# Patient Record
Sex: Male | Born: 1955 | Race: Black or African American | Hispanic: No | Marital: Married | Smoking: Never smoker
Health system: Southern US, Community
[De-identification: ages and names within clinical notes are randomized; demographics above are authoritative.]

## PROBLEM LIST (undated history)

## (undated) DIAGNOSIS — J45909 Unspecified asthma, uncomplicated: Secondary | ICD-10-CM

## (undated) DIAGNOSIS — I10 Essential (primary) hypertension: Secondary | ICD-10-CM

## (undated) DIAGNOSIS — T7840XA Allergy, unspecified, initial encounter: Secondary | ICD-10-CM

## (undated) DIAGNOSIS — H548 Legal blindness, as defined in USA: Secondary | ICD-10-CM

## (undated) DIAGNOSIS — C801 Malignant (primary) neoplasm, unspecified: Secondary | ICD-10-CM

## (undated) HISTORY — DX: Legal blindness, as defined in USA: H54.8

## (undated) HISTORY — PX: FOOT SURGERY: SHX648

## (undated) HISTORY — PX: HAND SURGERY: SHX662

## (undated) HISTORY — PX: COLON SURGERY: SHX602

## (undated) HISTORY — PX: KNEE SURGERY: SHX244

## (undated) HISTORY — DX: Malignant (primary) neoplasm, unspecified: C80.1

## (undated) HISTORY — DX: Essential (primary) hypertension: I10

## (undated) HISTORY — DX: Allergy, unspecified, initial encounter: T78.40XA

---

## 2006-05-27 ENCOUNTER — Emergency Department (HOSPITAL_COMMUNITY): Admission: EM | Admit: 2006-05-27 | Discharge: 2006-05-27 | Payer: Self-pay | Admitting: *Deleted

## 2006-05-31 ENCOUNTER — Emergency Department (HOSPITAL_COMMUNITY): Admission: EM | Admit: 2006-05-31 | Discharge: 2006-06-01 | Payer: Self-pay | Admitting: Emergency Medicine

## 2006-06-15 ENCOUNTER — Emergency Department (HOSPITAL_COMMUNITY): Admission: EM | Admit: 2006-06-15 | Discharge: 2006-06-16 | Payer: Self-pay | Admitting: Emergency Medicine

## 2007-02-14 ENCOUNTER — Emergency Department (HOSPITAL_COMMUNITY): Admission: EM | Admit: 2007-02-14 | Discharge: 2007-02-14 | Payer: Self-pay | Admitting: *Deleted

## 2007-08-01 ENCOUNTER — Emergency Department (HOSPITAL_COMMUNITY): Admission: EM | Admit: 2007-08-01 | Discharge: 2007-08-01 | Payer: Self-pay | Admitting: Emergency Medicine

## 2009-09-04 ENCOUNTER — Emergency Department (HOSPITAL_COMMUNITY): Admission: EM | Admit: 2009-09-04 | Discharge: 2009-09-04 | Payer: Self-pay | Admitting: Emergency Medicine

## 2010-02-12 ENCOUNTER — Emergency Department (HOSPITAL_COMMUNITY): Admission: EM | Admit: 2010-02-12 | Discharge: 2010-02-12 | Payer: Self-pay | Admitting: Emergency Medicine

## 2010-06-16 ENCOUNTER — Emergency Department (HOSPITAL_COMMUNITY): Admission: EM | Admit: 2010-06-16 | Discharge: 2010-06-16 | Payer: Self-pay | Admitting: Emergency Medicine

## 2010-09-23 ENCOUNTER — Emergency Department (HOSPITAL_COMMUNITY)
Admission: EM | Admit: 2010-09-23 | Discharge: 2010-09-23 | Payer: Self-pay | Source: Home / Self Care | Admitting: Emergency Medicine

## 2010-10-15 ENCOUNTER — Emergency Department (HOSPITAL_COMMUNITY)
Admission: EM | Admit: 2010-10-15 | Discharge: 2010-10-15 | Payer: Self-pay | Source: Home / Self Care | Admitting: Emergency Medicine

## 2011-04-08 ENCOUNTER — Ambulatory Visit
Admission: RE | Admit: 2011-04-08 | Discharge: 2011-04-08 | Disposition: A | Discharge status: Home / Self Care | Payer: Medicare Other | Source: Ambulatory Visit | Attending: Gastroenterology | Admitting: Gastroenterology

## 2011-04-08 ENCOUNTER — Other Ambulatory Visit: Payer: Self-pay | Admitting: Gastroenterology

## 2011-04-08 DIAGNOSIS — K6389 Other specified diseases of intestine: Secondary | ICD-10-CM

## 2011-04-08 MED ORDER — IOHEXOL 300 MG/ML  SOLN
125.0000 mL | Freq: Once | INTRAMUSCULAR | Status: AC | PRN
  Administered 2011-04-08: 125 mL via INTRAVENOUS

## 2011-04-16 ENCOUNTER — Ambulatory Visit (INDEPENDENT_AMBULATORY_CARE_PROVIDER_SITE_OTHER): Payer: Medicare Other | Admitting: Surgery

## 2011-04-16 ENCOUNTER — Encounter (INDEPENDENT_AMBULATORY_CARE_PROVIDER_SITE_OTHER): Payer: Self-pay | Admitting: Surgery

## 2011-04-16 ENCOUNTER — Ambulatory Visit (INDEPENDENT_AMBULATORY_CARE_PROVIDER_SITE_OTHER): Payer: Medicare Other | Admitting: General Surgery

## 2011-04-16 VITALS — BP 142/79 | HR 71 | Temp 97.7°F | Ht 69.0 in | Wt 299.0 lb

## 2011-04-16 DIAGNOSIS — I10 Essential (primary) hypertension: Secondary | ICD-10-CM

## 2011-04-16 DIAGNOSIS — E669 Obesity, unspecified: Secondary | ICD-10-CM

## 2011-04-16 DIAGNOSIS — C189 Malignant neoplasm of colon, unspecified: Secondary | ICD-10-CM

## 2011-04-16 NOTE — Patient Instructions (Signed)
Office to schedule surgery to remove sigmoid colon cancer.

## 2011-04-16 NOTE — Progress Notes (Signed)
Subjective:     Patient ID: Ralph Ryan, male   DOB: 06-Sep-1956, 55 y.o.   MRN: [id number]    BP 142/79  Pulse 71  Temp(Src) 97.7 F (36.5 C) (Temporal)  Ht 5\' 9"  (1.753 m)  Wt 299 lb (135.626 kg)  BMI 44.15 kg/m2    HPI The patient is sent today at the request of Dr. Jeani Hawking do to a mass in his sigmoid colon. The patient was having rectal bleeding and constipation. He underwent a colonoscopy on April 07, 2011 for rectal bleeding and constipation. His daughter states that he has also had abdominal pain. The pain is diffuse and not severe. It is any cramping abdominal pain. It comes and goes. Last only for a few minutes to a few hours. The rectal bleeding is noted when he wipes. There is no bright red blood. 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 He is 22 cm from the anal verge.  Review of Systems  Constitutional: Positive for fatigue.  HENT: Negative.   Eyes: Negative.   Respiratory: Positive for shortness of breath. Negative for stridor.   Cardiovascular: Positive for leg swelling. Negative for chest pain.  Gastrointestinal: Positive for abdominal pain, constipation, blood in stool and anal bleeding.  Genitourinary: Negative.   Musculoskeletal: Negative.   Skin: Negative.   Neurological: Negative.   Hematological: Negative.   Psychiatric/Behavioral: Negative.        Objective:   Physical Exam  Constitutional: He is oriented to person, place, and time. He appears well-developed and well-nourished.  HENT:  Head: Normocephalic and atraumatic.  Eyes: Conjunctivae are normal. Pupils are equal, round, and reactive to light.  Neck: Normal range of motion. Neck supple.  Cardiovascular: Normal rate, regular rhythm and normal heart sounds.  Exam reveals no friction rub.   No murmur heard. Pulmonary/Chest: Effort normal and breath sounds normal. No respiratory distress. He has no wheezes.  Abdominal: Soft. Bowel sounds are  normal. He exhibits no distension and no mass. There is no tenderness. There is no rebound.  Musculoskeletal: Normal range of motion. He exhibits tenderness.  Neurological: He is alert and oriented to person, place, and time.  Skin: Skin is warm and dry.  Psychiatric: He has a normal mood and affect. His behavior is normal.       Assessment:   Sigmoid colon cancer Plan:   The patient has an adenocarcinoma in the sigmoid colon. This is biopsy-proven to be adenocarcinoma. It is 22 cm from the anal verge. CT scan of his abdomen and pelvis reveals no metastatic disease. He is obese. He also has other medical problems. I discussed treatment options and feel that sigmoid colectomy is his best option. This is not amenable to endoscopic resection and is not recommended. This can be done with a laparoscopic approach. I discussed the risks of the procedure with the patient and his family today. Risks include bleeding, infection, anastomotic leakage, sepsis, potential need for colostomy, further surgery, cardiovascular complications, DVT, and worsening of pre-existing medical conditions. They understand these potential complications but wished to proceed with surgical treatment of this problem.

## 2011-04-23 ENCOUNTER — Encounter (INDEPENDENT_AMBULATORY_CARE_PROVIDER_SITE_OTHER): Payer: Medicare Other | Admitting: General Surgery

## 2011-04-28 ENCOUNTER — Encounter (HOSPITAL_COMMUNITY)
Admission: RE | Admit: 2011-04-28 | Discharge: 2011-04-28 | Disposition: A | Discharge status: Home / Self Care | Payer: Medicare Other | Source: Ambulatory Visit | Attending: Surgery | Admitting: Surgery

## 2011-04-28 LAB — COMPREHENSIVE METABOLIC PANEL
ALT: 9 U/L (ref 0–53)
Alkaline Phosphatase: 64 U/L (ref 39–117)
Chloride: 101 mEq/L (ref 96–112)
Creatinine, Ser: 0.97 mg/dL (ref 0.50–1.35)
GFR calc Af Amer: >60 mL/min (ref >60)
GFR calc non Af Amer: >60 mL/min (ref >60)
Potassium: 4.3 mEq/L (ref 3.5–5.1)
Sodium: 136 mEq/L (ref 135–145)
Total Bilirubin: 0.2 mg/dL — ABNORMAL LOW (ref 0.3–1.2)

## 2011-04-28 LAB — SURGICAL PCR SCREEN
MRSA, PCR: NEGATIVE
Staphylococcus aureus: NEGATIVE

## 2011-04-28 LAB — DIFFERENTIAL
Basophils Absolute: 0 10*3/uL (ref 0.0–0.1)
Eosinophils Relative: 2 % (ref 0–5)
Lymphocytes Relative: 42 % (ref 12–46)
Lymphs Abs: 1.8 10*3/uL (ref 0.7–4.0)
Monocytes Absolute: 0.4 10*3/uL (ref 0.1–1.0)
Monocytes Relative: 8 % (ref 3–12)
Neutrophils Relative %: 47 % (ref 43–77)

## 2011-04-28 LAB — CBC
MCH: 28.6 pg (ref 26.0–34.0)
MCHC: 33.3 g/dL (ref 30.0–36.0)
Platelets: 222 10*3/uL (ref 150–400)
RBC: 4.19 MIL/uL — ABNORMAL LOW (ref 4.22–5.81)
RDW: 14.2 % (ref 11.5–15.5)

## 2011-04-30 ENCOUNTER — Encounter (INDEPENDENT_AMBULATORY_CARE_PROVIDER_SITE_OTHER): Payer: Self-pay | Admitting: Surgery

## 2011-04-30 ENCOUNTER — Ambulatory Visit (INDEPENDENT_AMBULATORY_CARE_PROVIDER_SITE_OTHER): Payer: Medicare Other | Admitting: Surgery

## 2011-04-30 VITALS — BP 138/96 | HR 60 | Temp 97.2°F | Ht 69.5 in | Wt 301.4 lb

## 2011-04-30 DIAGNOSIS — C189 Malignant neoplasm of colon, unspecified: Secondary | ICD-10-CM

## 2011-04-30 NOTE — Progress Notes (Signed)
This patient's care has been transferred to me by Dr. Luisa Hart. This was due to scheduling conflicts. The patient and his family are agreeable with the care transfer. I asked him to come in to the office today to discuss the upcoming surgery and the expected postoperative course. As expected, the patient is fairly apprehensive about his surgery. I spent some time explaining the procedure, which will be a laparoscopic assisted sigmoid colectomy with primary anastomosis. We discussed the expected postoperative course. They had questions about the possibility of chemotherapy but I explained that this would not be determined until the pathology report report had returned. It seems that they are satisfied with all of the questions and answers to this point. He has been given a structures for his bowel prep. He will be on the Entereg protocol.

## 2011-05-06 ENCOUNTER — Other Ambulatory Visit (INDEPENDENT_AMBULATORY_CARE_PROVIDER_SITE_OTHER): Payer: Self-pay | Admitting: Surgery

## 2011-05-06 ENCOUNTER — Inpatient Hospital Stay (HOSPITAL_COMMUNITY)
Admission: RE | Admit: 2011-05-06 | Discharge: 2011-05-10 | DRG: 330 | Disposition: A | Discharge status: Home / Self Care | Payer: Medicare Other | Source: Ambulatory Visit | Attending: Surgery | Admitting: Surgery

## 2011-05-06 DIAGNOSIS — C187 Malignant neoplasm of sigmoid colon: Principal | ICD-10-CM | POA: Diagnosis present

## 2011-05-06 DIAGNOSIS — Z6841 Body Mass Index (BMI) 40.0 and over, adult: Secondary | ICD-10-CM

## 2011-05-06 DIAGNOSIS — H548 Legal blindness, as defined in USA: Secondary | ICD-10-CM | POA: Diagnosis present

## 2011-05-06 DIAGNOSIS — I10 Essential (primary) hypertension: Secondary | ICD-10-CM | POA: Diagnosis present

## 2011-05-06 DIAGNOSIS — J45909 Unspecified asthma, uncomplicated: Secondary | ICD-10-CM | POA: Diagnosis present

## 2011-05-06 DIAGNOSIS — Z01812 Encounter for preprocedural laboratory examination: Secondary | ICD-10-CM

## 2011-05-06 DIAGNOSIS — Z5331 Laparoscopic surgical procedure converted to open procedure: Secondary | ICD-10-CM

## 2011-05-06 DIAGNOSIS — E669 Obesity, unspecified: Secondary | ICD-10-CM | POA: Diagnosis present

## 2011-05-06 DIAGNOSIS — Z880 Allergy status to penicillin: Secondary | ICD-10-CM

## 2011-05-06 DIAGNOSIS — C189 Malignant neoplasm of colon, unspecified: Secondary | ICD-10-CM

## 2011-05-06 HISTORY — PX: COLECTOMY: SHX59

## 2011-05-06 LAB — TYPE AND SCREEN: ABO/RH(D): A POS

## 2011-05-06 LAB — ABO/RH: ABO/RH(D): A POS

## 2011-05-07 LAB — BASIC METABOLIC PANEL
BUN: 7 mg/dL (ref 6–23)
CO2: 31 mEq/L (ref 19–32)
Chloride: 99 mEq/L (ref 96–112)
GFR calc Af Amer: >60 mL/min (ref >60)
GFR calc non Af Amer: >60 mL/min (ref >60)
Glucose, Bld: 114 mg/dL — ABNORMAL HIGH (ref 70–99)

## 2011-05-07 LAB — CBC
MCH: 28.2 pg (ref 26.0–34.0)
MCHC: 32.6 g/dL (ref 30.0–36.0)
MCV: 86.6 fL (ref 78.0–100.0)
Platelets: 220 10*3/uL (ref 150–400)
WBC: 9.1 10*3/uL (ref 4.0–10.5)

## 2011-05-08 NOTE — Op Note (Signed)
NAMETHAD, OSORIA              ACCOUNT NO.:  [id number]  MEDICAL RECORD NO.:  [id number]  LOCATION:  5114                         FACILITY:  [facility]  PHYSICIAN:  Wilmon Arms. Corliss Skains, M.D. DATE OF BIRTH:  09/18/1956  DATE OF PROCEDURE:  05/06/2011 DATE OF DISCHARGE:                              OPERATIVE REPORT   PREOPERATIVE DIAGNOSIS:  Sigmoid colon cancer.  POSTOPERATIVE DIAGNOSIS:  Sigmoid colon cancer.  PROCEDURE:  Sigmoid colectomy.  SURGEON:  Wilmon Arms. Corliss Skains, MD  ANESTHESIA:  General.  ASSISTANT:  Dr. Arlys John Latent.  ANESTHESIA:  General.  INDICATIONS:  This is a 55 year old male who recently presented with constipation and rectal bleeding.  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  He was originally scheduled to see Dr. Luisa Hart, but his care was transferred to me due to scheduling issues.  He presents now for sigmoid colectomy.  DESCRIPTION OF PROCEDURE:  The patient was brought to the operating room and placed in the supine position on the operating room table.  After an adequate level of general anesthesia was obtained, the patient's legs were placed in the lithotomy position in Yellofin stirrups.  A Foley catheter was placed under sterile technique.  The patient's abdomen was shaved, prepped with ChloraPrep and draped in sterile fashion.  Time-out was taken to ensure the proper patient, proper procedure.  We began by using 5-mm OptiView port into the peritoneal cavity in the right upper quadrant.  We insufflated CO2 maintaining at maximal pressure of 15 mmHg.  We inserted a laparoscope.  The patient was obese and has an enormous amount of omentum.  He had a small adhesion to small umbilical hernia.  We placed two other 5 mm ports on his right side.  We then attempted to visualize the colon.  The patient has an enormous amount of omentum and intraperitoneal fat and we had a lot of  difficulty visualizing the sigmoid colon.  After trying to obtain better exposure, we made decision to convert to an open procedure.  We made a vertical midline incision.  Dissection was carried down through the fascia with cautery.  We entered the peritoneal cavity.  I did not visualize any peritoneal studding.  The omentum appeared normal.  We used a Bookwalter retractor and packed small bowel into the upper abdomen.  Exposure was quite difficult due to the patient's size.  We mobilized the sigmoid colon medially by dividing the white line of Toldt.  We dissected down towards the pelvis.  We identified the left ureter.  There was some tattooed ink which was visualized in the distal sigmoid colon.  We could palpate the tumor just inferior to this area.  There were lot of adhesions of the colon to the right side of the pelvis.  We took these down carefully.  We opened the peritoneum on the right side of the pelvis as well.  We selected a point about 10-cm proximal to the palpable mass.  We divided this with a GIA-75 stapler.  We then used the Enseal device to take down the mesentery heading down towards the pelvis.  The patient has a  fairly narrow pelvis for such a large size. We continued down until we were several centimeters past the tumor.  We divided the upper rectum with a contour green load stapler.  This specimen  was oriented with a suture proximal.  This was removed and sent for pathologic examination.  Then irrigated thoroughly.  Hemostasis was good.  We mobilized the left colon a little bit more to eliminate tension.  It seemed that the staple line of the proximal stump reached easily into the pelvis to the rectum.  We then amputated the staple line and I used the EEA sizers to select a 29 mm stapler.  We placed a pursestring suture of 2-0 Prolene.  The anvil was inserted and the pursestring was tied down.  I then went below and dilate his rectum up to three fingers.  We  passed the 29-mm dilator in the rectum and this felt to pass easily with no resistance.  We then removed the dilator and passed the stapler until we were at the end of the staple line.  The spike was advanced and mated with the anvil.  We close down the stapler and held it for several minutes and then fired the EEA stapler.  Good anastomosis was created.  We tested our anastomosis with a rigid sigmoidoscope insufflating air.  No air bubbles were seen.  The anastomosis did not appear to be under any tension.  We examined the stapler.  We have a very good distal donut which was sent for pathologic examination.  We could see a pursestring suture in the proximal donut which was not circumferentially intact.  However, we inspected the anastomosis visually and with insufflation that it seemed to be no sign of leak.  We then irrigated thoroughly.  The abdomen was closed with double-stranded #1 PDS suture.  The subcutaneous tissues were thoroughly irrigated.  The staples were used to close the midline incision as well as the port sites.  The patient was extubated and brought to recovery room in stable condition.  All sponge, instrument, and needle counts were correct.     Wilmon Arms. Corliss Skains, M.D.     MKT/MEDQ  D:  05/06/2011  T:  05/07/2011  Job:  [id number]  Electronically Signed by Manus Rudd M.D. on 05/08/2011 07:16:07 AM

## 2011-05-12 ENCOUNTER — Other Ambulatory Visit (INDEPENDENT_AMBULATORY_CARE_PROVIDER_SITE_OTHER): Payer: Self-pay | Admitting: General Surgery

## 2011-05-12 DIAGNOSIS — C189 Malignant neoplasm of colon, unspecified: Secondary | ICD-10-CM

## 2011-05-12 NOTE — Discharge Summary (Signed)
  Ralph Ryan, Ralph Ryan              ACCOUNT NO.:  [id number]  MEDICAL RECORD NO.:  [id number]  LOCATION:  5114                         FACILITY:  [facility]  PHYSICIAN:  Wilmon Arms. Corliss Skains, M.D. DATE OF BIRTH:  [birth year]  DATE OF ADMISSION:  05/06/2011 DATE OF DISCHARGE:  05/10/2011                              DISCHARGE SUMMARY   ADMISSION DIAGNOSIS:  Sigmoid colon cancer.  POSTOPERATIVE DIAGNOSIS:  Sigmoid colon cancer.  PROCEDURE:  Sigmoid colectomy.  BRIEF HISTORY:  This is a 55 year old male who recently presented with constipation and rectal bleeding.  A colonoscopy showed invasive adenocarcinoma of the sigmoid colon about 22 cm from the anal verge. Preoperative CT scan showed no sign of metastatic disease.  HOSPITAL COURSE:  He was admitted to the hospital on May 06, 2011 and underwent open sigmoid colectomy.  He had an end-to-end stapled anastomosis.  Postoperatively, the patient has done quite well.  He was maintained on clear liquids for a couple days.  He started having small bowel movements and his diet was advanced on postop day #3.  He had several bowel movements but towards the end of the day, his bowel movements became more solid.  He is tolerating a regular diet.  His pain is controlled with Percocet.  DISCHARGE INSTRUCTIONS:  He is given Percocet p.r.n. for pain.  Follow up in 1 week for staple removal.  Dr. Fatima Sanger office will call to schedule the appointment.  He is given abdominal binder to support his abdomen.  He may shower over the staple line.  He should refrain from any heavy lifting.  We will call him with the pathology report as it is not yet available.     Wilmon Arms. Corliss Skains, M.D.     MKT/MEDQ  D:  05/10/2011  T:  05/10/2011  Job:  [id number]  Electronically Signed by Manus Rudd M.D. on 05/12/2011 08:10:26 AM

## 2011-05-14 ENCOUNTER — Telehealth (INDEPENDENT_AMBULATORY_CARE_PROVIDER_SITE_OTHER): Payer: Self-pay

## 2011-05-14 NOTE — Telephone Encounter (Signed)
Pt called and wants to know what appointment he is scheduled for on August 6th.  I couldn't find a record of this so I will have Pattricia Boss call him tomorrow.  He also asked about when will he have a bm.  It has been 2 days but he is passing gas and drinking plenty of liquids.  I encouraged to continue drinking plenty of liquids and walk around and that he could take Milk of Magnesia.

## 2011-05-15 NOTE — Telephone Encounter (Signed)
Called Mr Meador and told him that he has a appt at the [facility] on 05-25-11 and to take M O M and drink a lot of water and walk around the house he stated that he is passing gas and a small BM this morining  Pattricia Boss 05-15-11

## 2011-05-19 ENCOUNTER — Encounter (INDEPENDENT_AMBULATORY_CARE_PROVIDER_SITE_OTHER): Payer: Medicare Other | Admitting: Surgery

## 2011-05-19 ENCOUNTER — Ambulatory Visit (INDEPENDENT_AMBULATORY_CARE_PROVIDER_SITE_OTHER): Payer: Medicare Other | Admitting: General Surgery

## 2011-05-19 DIAGNOSIS — Z4802 Encounter for removal of sutures: Secondary | ICD-10-CM

## 2011-05-19 NOTE — Progress Notes (Signed)
Mr Moten came in today for a nurse only and had staples removal the wound looked good and I placed steri strap over the staple line and I made pt appt on 06-01-11 at 2:10 to come back into see Dr Corliss Skains in clinic

## 2011-05-25 ENCOUNTER — Encounter (HOSPITAL_BASED_OUTPATIENT_CLINIC_OR_DEPARTMENT_OTHER): Payer: Medicare Other | Admitting: Oncology

## 2011-05-25 DIAGNOSIS — C187 Malignant neoplasm of sigmoid colon: Secondary | ICD-10-CM

## 2011-05-25 DIAGNOSIS — I10 Essential (primary) hypertension: Secondary | ICD-10-CM

## 2011-05-25 DIAGNOSIS — H543 Unqualified visual loss, both eyes: Secondary | ICD-10-CM

## 2011-06-01 ENCOUNTER — Encounter (INDEPENDENT_AMBULATORY_CARE_PROVIDER_SITE_OTHER): Payer: Self-pay | Admitting: Surgery

## 2011-06-01 ENCOUNTER — Ambulatory Visit (INDEPENDENT_AMBULATORY_CARE_PROVIDER_SITE_OTHER): Payer: Medicare Other | Admitting: Surgery

## 2011-06-01 VITALS — Temp 97.9°F | Wt 292.2 lb

## 2011-06-01 DIAGNOSIS — C189 Malignant neoplasm of colon, unspecified: Secondary | ICD-10-CM

## 2011-06-01 NOTE — Progress Notes (Signed)
S/p sigmoid colon resection for T3N0 adenocarcinoma.  He has seen Dr. Truett Perna in consultation and they have made the decision to proceed with Xeloda.  Otherwise, he is doing well with good appetite and regular bowel movements.  His incision is well-healed with no sign of infection or hernia.  Follow-up on a PRN basis.

## 2011-06-09 ENCOUNTER — Other Ambulatory Visit: Payer: Self-pay | Admitting: Oncology

## 2011-06-09 ENCOUNTER — Encounter (HOSPITAL_BASED_OUTPATIENT_CLINIC_OR_DEPARTMENT_OTHER): Payer: Medicare Other | Admitting: Oncology

## 2011-06-09 DIAGNOSIS — C187 Malignant neoplasm of sigmoid colon: Secondary | ICD-10-CM

## 2011-06-09 LAB — COMPREHENSIVE METABOLIC PANEL
AST: 15 U/L (ref 0–37)
Albumin: 4.4 g/dL (ref 3.5–5.2)
BUN: 16 mg/dL (ref 6–23)
Calcium: 9.8 mg/dL (ref 8.4–10.5)
Chloride: 99 mEq/L (ref 96–112)
Creatinine, Ser: 1.08 mg/dL (ref 0.50–1.35)
Glucose, Bld: 105 mg/dL — ABNORMAL HIGH (ref 70–99)

## 2011-06-09 LAB — CEA: CEA: 1.8 ng/mL (ref 0.0–5.0)

## 2011-06-09 LAB — CBC WITH DIFFERENTIAL/PLATELET
Basophils Absolute: 0 10*3/uL (ref 0.0–0.1)
Eosinophils Absolute: 0.1 10*3/uL (ref 0.0–0.5)
HCT: 34.8 % — ABNORMAL LOW (ref 38.4–49.9)
LYMPH%: 33.4 % (ref 14.0–49.0)
MCV: 87.2 fL (ref 79.3–98.0)
MONO%: 7.7 % (ref 0.0–14.0)
NEUT#: 2.5 10*3/uL (ref 1.5–6.5)
RBC: 4 10*6/uL — ABNORMAL LOW (ref 4.20–5.82)
WBC: 4.3 10*3/uL (ref 4.0–10.3)

## 2011-07-02 ENCOUNTER — Encounter (HOSPITAL_BASED_OUTPATIENT_CLINIC_OR_DEPARTMENT_OTHER): Payer: Medicare Other | Admitting: Oncology

## 2011-07-02 ENCOUNTER — Other Ambulatory Visit: Payer: Self-pay | Admitting: Oncology

## 2011-07-02 DIAGNOSIS — C187 Malignant neoplasm of sigmoid colon: Secondary | ICD-10-CM

## 2011-07-02 LAB — CBC WITH DIFFERENTIAL/PLATELET
Basophils Absolute: 0 10*3/uL (ref 0.0–0.1)
Eosinophils Absolute: 0 10*3/uL (ref 0.0–0.5)
HGB: 11.8 g/dL — ABNORMAL LOW (ref 13.0–17.1)
MCV: 88.3 fL (ref 79.3–98.0)
MONO#: 0.4 10*3/uL (ref 0.1–0.9)
NEUT#: 2.3 10*3/uL (ref 1.5–6.5)
RBC: 4.06 10*6/uL — ABNORMAL LOW (ref 4.20–5.82)
RDW: 16.4 % — ABNORMAL HIGH (ref 11.0–14.6)
WBC: 4.1 10*3/uL (ref 4.0–10.3)
lymph#: 1.4 10*3/uL (ref 0.9–3.3)

## 2011-07-20 ENCOUNTER — Other Ambulatory Visit: Payer: Self-pay | Admitting: Oncology

## 2011-07-20 ENCOUNTER — Encounter (HOSPITAL_BASED_OUTPATIENT_CLINIC_OR_DEPARTMENT_OTHER): Payer: Medicare Other | Admitting: Oncology

## 2011-07-20 DIAGNOSIS — C187 Malignant neoplasm of sigmoid colon: Secondary | ICD-10-CM

## 2011-07-20 DIAGNOSIS — R21 Rash and other nonspecific skin eruption: Secondary | ICD-10-CM

## 2011-07-20 LAB — CBC WITH DIFFERENTIAL/PLATELET
Basophils Absolute: 0 10*3/uL (ref 0.0–0.1)
Eosinophils Absolute: 0.1 10*3/uL (ref 0.0–0.5)
HCT: 37.2 % — ABNORMAL LOW (ref 38.4–49.9)
HGB: 12.3 g/dL — ABNORMAL LOW (ref 13.0–17.1)
LYMPH%: 45.5 % (ref 14.0–49.0)
MCV: 86.5 fL (ref 79.3–98.0)
MONO#: 0.3 10*3/uL (ref 0.1–0.9)
MONO%: 9.6 % (ref 0.0–14.0)
NEUT#: 1.5 10*3/uL (ref 1.5–6.5)
Platelets: 183 10*3/uL (ref 140–400)
RBC: 4.3 10*6/uL (ref 4.20–5.82)
WBC: 3.5 10*3/uL — ABNORMAL LOW (ref 4.0–10.3)

## 2011-07-20 LAB — COMPREHENSIVE METABOLIC PANEL
ALT: 18 U/L (ref 0–53)
AST: 20 U/L (ref 0–37)
Alkaline Phosphatase: 64 U/L (ref 39–117)
Glucose, Bld: 105 mg/dL — ABNORMAL HIGH (ref 70–99)
Sodium: 137 mEq/L (ref 135–145)
Total Bilirubin: 0.7 mg/dL (ref 0.3–1.2)
Total Protein: 8 g/dL (ref 6.0–8.3)

## 2011-08-21 ENCOUNTER — Other Ambulatory Visit: Payer: Self-pay | Admitting: Oncology

## 2011-08-21 ENCOUNTER — Telehealth: Payer: Self-pay | Admitting: Oncology

## 2011-08-21 ENCOUNTER — Encounter (HOSPITAL_BASED_OUTPATIENT_CLINIC_OR_DEPARTMENT_OTHER): Payer: Medicare Other | Admitting: Oncology

## 2011-08-21 DIAGNOSIS — I10 Essential (primary) hypertension: Secondary | ICD-10-CM

## 2011-08-21 DIAGNOSIS — H543 Unqualified visual loss, both eyes: Secondary | ICD-10-CM

## 2011-08-21 DIAGNOSIS — C187 Malignant neoplasm of sigmoid colon: Secondary | ICD-10-CM

## 2011-08-21 DIAGNOSIS — R109 Unspecified abdominal pain: Secondary | ICD-10-CM

## 2011-08-21 LAB — CBC WITH DIFFERENTIAL/PLATELET
Eosinophils Absolute: 0 10*3/uL (ref 0.0–0.5)
LYMPH%: 35.4 % (ref 14.0–49.0)
MCH: 30 pg (ref 27.2–33.4)
MCV: 89.2 fL (ref 79.3–98.0)
MONO%: 9.1 % (ref 0.0–14.0)
NEUT#: 2.2 10*3/uL (ref 1.5–6.5)
Platelets: 264 10*3/uL (ref 140–400)
RBC: 4.04 10*6/uL — ABNORMAL LOW (ref 4.20–5.82)

## 2011-08-21 LAB — COMPREHENSIVE METABOLIC PANEL
Alkaline Phosphatase: 73 U/L (ref 39–117)
BUN: 13 mg/dL (ref 6–23)
Glucose, Bld: 103 mg/dL — ABNORMAL HIGH (ref 70–99)
Sodium: 139 mEq/L (ref 135–145)
Total Bilirubin: 0.6 mg/dL (ref 0.3–1.2)
Total Protein: 8.1 g/dL (ref 6.0–8.3)

## 2011-08-21 NOTE — Telephone Encounter (Signed)
gv pt appt schdule for nov and appt w/dr tseui for 11/7 @ 2:40 pm. Copy of 11/2 pof sent to mr to send notes to [facility],

## 2011-08-24 ENCOUNTER — Telehealth: Payer: Self-pay | Admitting: *Deleted

## 2011-08-24 NOTE — Telephone Encounter (Signed)
PT. CALLED. HE WANTED TO KNOW HOW MUCH LONGER WILL HE HAVE TO TAKE THE CHEMO MEDICATION? INFORMED PT. THAT HE WILL NEED TO ASK DR. SHERRILL THAT QUESTION AT HIS NEXT APPOINTMENT ON 09/09/11. PT. VOICES UNDERSTANDING.

## 2011-08-24 NOTE — Progress Notes (Signed)
CC:   Ralph Ryan. Ralph Ryan, M.D. Ralph Ryan, M.D. Ralph Hawks Elnoria Howard, MD  [name] returns as scheduled.  He began another cycle of Xeloda on 10/11.  He denies mouth sores and diarrhea.  The hand-foot discomfort and skin changes have improved.  He complains of intermittent abdominal pain.  This has been present for the past few weeks.  He reports 1 episode of nausea and vomiting.  The pain is chiefly in the right abdomen.  He reports intermittent "dizziness" when he is ambulating outside of the home.  PHYSICAL EXAMINATION:  Pressure 125/79, pulse 70, temperature 97.2, weight 283.8 (278.8 on 10/01). HEENT:  No thrush or ulcers.  Lungs: Clear.  Cardiac:  Regular rhythm.  Abdomen:  Soft.  No hepatosplenomegaly.  No mass.  Active bowel sounds.  There is tenderness in the right mid abdomen.  Vascular:  No leg edema.  Skin:  There is skin thickening, hyperpigmentation, and dry desquamation at the feet greater than hands.  No discrete ulcer or erythema.  LABORATORY DATA:  Hemoglobin 12.1, platelets 264,000, white count 4.1, ANC 2.2.  IMPRESSION/PLAN: 1. Stage II (T3 N0) well differentiated adenocarcinoma of the sigmoid     colon, status post a sigmoid colectomy.  He began adjuvant Xeloda     chemotherapy 06/10/2011.  He began cycle #3 of adjuvant Xeloda on     07/30/2011 with a dose reduction. 2. Hand-foot syndrome secondary to Xeloda, improved with a dose     reduction beginning with cycle #3. 3. Hypertension. 4. Asthma. 5. Legal blindness. 6. Right mid abdominal pain and tenderness, etiology unclear.  I doubt     the pain is related to Xeloda chemotherapy.  He will schedule a     followup appointment with Dr. Corliss Ryan if the pain persists. 7. Disposition.  [name] tolerated the 3rd cycle of Xeloda well.     The plan is to begin cycle #4 on 11/02.  He will continue Xeloda at     the current dose of 1000 mg twice daily for 14 days. [name] will return for an office visit on  11/21.  He will contact us in the interim for increased abdominal pain or new symptoms.    ______________________________ Ladene Artist, M.D. GBS/MEDQ  D:  08/21/2011  T:  08/24/2011  Job:  [id number]

## 2011-08-25 NOTE — Telephone Encounter (Signed)
NO NOTE

## 2011-08-26 ENCOUNTER — Encounter (INDEPENDENT_AMBULATORY_CARE_PROVIDER_SITE_OTHER): Payer: Self-pay | Admitting: Surgery

## 2011-08-26 ENCOUNTER — Ambulatory Visit (INDEPENDENT_AMBULATORY_CARE_PROVIDER_SITE_OTHER): Payer: Medicare Other | Admitting: Surgery

## 2011-08-26 VITALS — BP 128/84 | HR 71 | Temp 97.4°F | Resp 16 | Ht 68.5 in | Wt 284.4 lb

## 2011-08-26 DIAGNOSIS — C189 Malignant neoplasm of colon, unspecified: Secondary | ICD-10-CM

## 2011-08-26 NOTE — Patient Instructions (Signed)
Your abdominal tenderness seems to be coming from the sutures used to close your muscle.  If this continues to bother you, you may use the elastic abdominal binder that you had in the hospital.  Let me know if you continue to have this problem.

## 2011-08-26 NOTE — Progress Notes (Signed)
The patient has been taking Xeloda under the care of Dr. Truett Perna.  For the last several weeks, he has had some intermittent left-sided abdominal pain.  These episodes are very brief and feel sharp, like a knife.  The pain resolves quickly with Tylenol.  Appetite and bowel movements are normal.  He does experience some dizziness with the Xeloda.  On examination, his midline incision is well-healed with no sign of hernia.  He has point tenderness in his LLQ just about 2 cm lateral to his midline incision.  No hernia in this area.    Imp:  Point tenderness in his abdominal wall - this probably represents a heavy suture from his fascial closure.  The pain seems to be fairly intermittent and brief.    Recs:  At this time,  I would not pursue any further work-up.  He may use his elastic abdominal binder as needed to help support his abdominal wall.  I would be glad to see him back if this pain persists.    Yousuf Ager K.

## 2011-08-31 ENCOUNTER — Telehealth: Payer: Self-pay | Admitting: *Deleted

## 2011-08-31 NOTE — Telephone Encounter (Signed)
RECEIVED A FAX FROM [facility] CONCERNING A PRESCRIPTION REFILL REQUEST FOR XELODA. THIS REQUEST WAS GIVEN TO DR.SHERRILL'S NURSE, SUSAN COWARD,RN. 

## 2011-09-01 ENCOUNTER — Telehealth: Payer: Self-pay | Admitting: *Deleted

## 2011-09-01 ENCOUNTER — Encounter: Payer: Self-pay | Admitting: *Deleted

## 2011-09-01 DIAGNOSIS — C187 Malignant neoplasm of sigmoid colon: Secondary | ICD-10-CM

## 2011-09-01 MED ORDER — CAPECITABINE 500 MG PO TABS: 1000.0000 mg | ORAL_TABLET | Freq: Two times a day (BID) | ORAL | Status: AC

## 2011-09-01 NOTE — Progress Notes (Signed)
RECEIVED A FAX FROM [facility] CONCERNING A CONFIRMATION OF FACSIMILE RECEIPT. THIS NOTICE WAS GIVEN TO DR.SHERRILL'S NURSE, SUSAN COWARD,RN. 

## 2011-09-01 NOTE — Telephone Encounter (Signed)
RECEIVED A FAX FROM [facility] CONCERNING A PRESCRIPTION REFILL REQUEST FOR XELODA. THIS REQUEST WAS GIVEN TO DR.SHERRILL'S NURSE, SUSAN COWARD,RN. 

## 2011-09-01 NOTE — Telephone Encounter (Signed)
REFILL SENT TO [facility]. NO REFILL NEEDED FROM [facility].

## 2011-09-07 ENCOUNTER — Telehealth: Payer: Self-pay | Admitting: *Deleted

## 2011-09-07 NOTE — Telephone Encounter (Signed)
PT. DROPPED ONE XELODA PILL ON THE FLOOR WHICH HAS MADE HIM SHORT ONE PILL. WHAT SHOULD HE DO? DR.TSUEI TOLD PT. "HIS MUSCLE IS NOT HEALING DUE TO HIS CHEMO". [facility] CALLED PT. ABOUT THE NEXT SHIPMENT OF HIS MEDICATION. WHEN DOES HE START HIS MEDICATION? VERBAL ORDER AND READ BACK TO DR.SHERRILL- IT IS OK TO MISS ONE XELODA PILL. DR.TSUEI 08/26/11 NOTE DID NOT MENTION ANY PROBLEM WITH PT.'S SURGICAL AREA NOT HEALING. NEXT MEDICATION SHIPMENT MAYBE SENT TO PT. BUT DO NOT TAKE MEDICATION UNTIL HE SEES DR.SHERRILL ON 09/09/11. THE ABOVE INFORMATION WAS GIVEN TO PT. IF PT. HAS ANY QUESTIONS HE MAY DISCUSS WITH DR.SHERRILL AT THE 09/09/11 VISIT. PT. VOICES UNDERSTANDING.

## 2011-09-09 ENCOUNTER — Ambulatory Visit (HOSPITAL_BASED_OUTPATIENT_CLINIC_OR_DEPARTMENT_OTHER): Payer: Medicare Other | Admitting: Nurse Practitioner

## 2011-09-09 ENCOUNTER — Other Ambulatory Visit: Payer: Self-pay | Admitting: Oncology

## 2011-09-09 ENCOUNTER — Other Ambulatory Visit (HOSPITAL_BASED_OUTPATIENT_CLINIC_OR_DEPARTMENT_OTHER): Payer: Medicare Other

## 2011-09-09 VITALS — BP 123/77 | HR 71 | Temp 97.0°F | Wt 297.3 lb

## 2011-09-09 DIAGNOSIS — I10 Essential (primary) hypertension: Secondary | ICD-10-CM

## 2011-09-09 DIAGNOSIS — C189 Malignant neoplasm of colon, unspecified: Secondary | ICD-10-CM

## 2011-09-09 DIAGNOSIS — C187 Malignant neoplasm of sigmoid colon: Secondary | ICD-10-CM

## 2011-09-09 DIAGNOSIS — H548 Legal blindness, as defined in USA: Secondary | ICD-10-CM

## 2011-09-09 LAB — CBC WITH DIFFERENTIAL/PLATELET
Basophils Absolute: 0 10*3/uL (ref 0.0–0.1)
Eosinophils Absolute: 0.1 10*3/uL (ref 0.0–0.5)
HGB: 11.2 g/dL — ABNORMAL LOW (ref 13.0–17.1)
LYMPH%: 34.3 % (ref 14.0–49.0)
MCV: 90.3 fL (ref 79.3–98.0)
MONO%: 9.4 % (ref 0.0–14.0)
NEUT#: 2.3 10*3/uL (ref 1.5–6.5)
NEUT%: 54.2 % (ref 39.0–75.0)
Platelets: 170 10*3/uL (ref 140–400)

## 2011-09-09 NOTE — Progress Notes (Signed)
OFFICE PROGRESS NOTE  Interval history:  Ralph Ryan returns as scheduled. He began cycle 4 adjuvant Xeloda on 08/21/2011. He denies mouth sores. He reports minimal nausea. He vomited 1 time.  No diarrhea. He denies hand or foot pain or redness.   Objective: Blood pressure 123/77, pulse 71, temperature 97 F (36.1 C), temperature source Oral, weight 297 lb 4.8 oz (134.854 kg).  Oropharynx is without thrush or ulceration. Mucous membranes are pink and moist. Lungs are clear. Regular cardiac rhythm. Abdomen is soft. No hepatomegaly. He is tender at the right mid abdomen. Trace lower pretibial edema bilaterally. Calves are nontender. Palms are nontender and without erythema. There is dry desquamation at the feet.   Lab Results: Hemoglobin 11.2 white count 4.3 absolute neutrophil count 2.3 platelet count 170,000   Studies/Results: No results found.  Medications: I have reviewed the patient's current medications.  Assessment/Plan:  1. 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  He began adjuvant Xeloda chemotherapy 06/10/2011.  He began cycle #3 of adjuvant Xeloda on 07/30/2011 with a dose reduction. He completed cycle 4 beginning 08/21/2011. 2. Hand-foot syndrome secondary to Xeloda, improved with a dose reduction beginning with cycle #3. 3. Hypertension. 4. Asthma. 5. Legal blindness. 6. Right mid abdominal pain and tenderness, etiology unclear. He has seen Dr. Corliss Ryan and is now wearing an abdominal binder.  Disposition-[name] appears stable. He will return for a followup visit on 10/01/2011. He will contact the office in the interim with any problems.  Plan reviewed with Dr. Truett Ryan.    Ralph Ryan ANP/GNP-BC

## 2011-09-24 ENCOUNTER — Telehealth: Payer: Self-pay | Admitting: *Deleted

## 2011-09-24 NOTE — Telephone Encounter (Signed)
PT. DID NOT TAKE TWO OF HIS XELODA PILLS THIS MORNING BECAUSE HE WAS GOING TO BE OUT IN THE SUN. HE HAS 1/2 BOTTLE LEFT. SPOKE TO DR.SHERRILL'S NURSE, TANYA WHITLOCK. NOTIFIED PT. TO SKIP THIS MORNING DOSE OF XELODA AND KEEP HIS 10/01/11 APPOINTMENT WITH LISA THOMAS,NP. PT.VOICES UNDERSTANDING.

## 2011-10-01 ENCOUNTER — Other Ambulatory Visit (HOSPITAL_BASED_OUTPATIENT_CLINIC_OR_DEPARTMENT_OTHER): Payer: Medicare Other | Admitting: Lab

## 2011-10-01 ENCOUNTER — Ambulatory Visit (HOSPITAL_BASED_OUTPATIENT_CLINIC_OR_DEPARTMENT_OTHER): Payer: Medicare Other | Admitting: Nurse Practitioner

## 2011-10-01 ENCOUNTER — Telehealth: Payer: Self-pay | Admitting: Oncology

## 2011-10-01 VITALS — BP 132/84 | HR 59 | Temp 97.2°F | Ht 68.5 in | Wt 291.9 lb

## 2011-10-01 DIAGNOSIS — J45909 Unspecified asthma, uncomplicated: Secondary | ICD-10-CM

## 2011-10-01 DIAGNOSIS — I10 Essential (primary) hypertension: Secondary | ICD-10-CM

## 2011-10-01 DIAGNOSIS — C189 Malignant neoplasm of colon, unspecified: Secondary | ICD-10-CM

## 2011-10-01 DIAGNOSIS — C187 Malignant neoplasm of sigmoid colon: Secondary | ICD-10-CM

## 2011-10-01 DIAGNOSIS — L538 Other specified erythematous conditions: Secondary | ICD-10-CM

## 2011-10-01 LAB — COMPREHENSIVE METABOLIC PANEL
AST: 16 U/L (ref 0–37)
Albumin: 4 g/dL (ref 3.5–5.2)
BUN: 18 mg/dL (ref 6–23)
CO2: 28 mEq/L (ref 19–32)
Calcium: 9.4 mg/dL (ref 8.4–10.5)
Chloride: 104 mEq/L (ref 96–112)
Creatinine, Ser: 1.07 mg/dL (ref 0.50–1.35)
Glucose, Bld: 98 mg/dL (ref 70–99)
Potassium: 4.3 mEq/L (ref 3.5–5.3)

## 2011-10-01 LAB — CBC WITH DIFFERENTIAL/PLATELET
BASO%: 0.9 % (ref 0.0–2.0)
Basophils Absolute: 0 10*3/uL (ref 0.0–0.1)
EOS%: 1.6 % (ref 0.0–7.0)
HCT: 36.4 % — ABNORMAL LOW (ref 38.4–49.9)
HGB: 12.2 g/dL — ABNORMAL LOW (ref 13.0–17.1)
MCH: 30.8 pg (ref 27.2–33.4)
MONO#: 0.3 10*3/uL (ref 0.1–0.9)
NEUT%: 45.8 % (ref 39.0–75.0)
RDW: 18.8 % — ABNORMAL HIGH (ref 11.0–14.6)
WBC: 4 10*3/uL (ref 4.0–10.3)
lymph#: 1.7 10*3/uL (ref 0.9–3.3)

## 2011-10-01 NOTE — Progress Notes (Signed)
OFFICE PROGRESS NOTE  Interval history:  Mr. Schueller returns as scheduled. He began cycle 5 adjuvant Xeloda chemotherapy 09/10/2011. He denies nausea or vomiting. No mouth sores. No hand or foot pain or redness. He denies diarrhea   Objective: Blood pressure 132/84, pulse 59, temperature 97.2 F (36.2 C), temperature source Oral, height 5' 8.5" (1.74 m), weight 291 lb 14.4 oz (132.405 kg).  Oropharynx is without thrush or ulceration. Lungs are clear. No wheezes or rales. Regular cardiac rhythm. Abdomen is soft and nontender. No hepatomegaly. Extremities are without edema. Palms are without erythema or skin breakdown. Soles are without erythema. There is mild dry desquamation at the heels bilaterally.   Lab Results: Lab Results  Component Value Date   WBC 4.0 10/01/2011   HGB 12.2* 10/01/2011   HCT 36.4* 10/01/2011   MCV 91.7 10/01/2011   PLT 192 10/01/2011       Studies/Results: No results found.  Medications: I have reviewed the patient's current medications.  Assessment/Plan:  1. 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 He began adjuvant Xeloda chemotherapy 06/10/2011. He began cycle #3 of adjuvant Xeloda on 07/30/2011 with a dose reduction. He completed cycle 4 beginning 08/21/2011. He completed cycle 5 beginning 09/10/2011. 2. Hand-foot syndrome secondary to Xeloda, improved with a dose reduction beginning with cycle #3. 3. Hypertension. 4. Asthma. 5. Legal blindness. 6. Right mid abdominal pain and tenderness, etiology unclear. He has seen Dr. Corliss Skains and is now wearing an abdominal binder.  Disposition-Ralph Ryan appears stable. Plan to proceed with cycle 6 beginning 10/02/2011. He will return for a followup visit 11/18/2010. He will contact the office in the interim with any problems.    Lonna Cobb ANP/GNP-BC

## 2011-10-01 NOTE — Telephone Encounter (Signed)
gve the pt his dec,jan 2013 appt calendar °

## 2011-10-12 ENCOUNTER — Telehealth: Payer: Self-pay | Admitting: *Deleted

## 2011-10-12 NOTE — Telephone Encounter (Signed)
Ralph Ryan. HAS USED ALL OF HIS UTTER CREAM. IS IT OK TO USE UTTER OINTMENT? ALSO Ralph Ryan. IS HAVING ABDOMINAL PAIN AT HIS INCISION. MAY USE THE UTTER OINTMENT. INSTRUCTED Ralph Ryan. TO CALL HIS SURGEON CONCERNING THE ABDOMINAL PAIN AT HIS INCISION. HE VOICES UNDERSTANDING.

## 2011-10-16 ENCOUNTER — Encounter: Payer: Self-pay | Admitting: *Deleted

## 2011-10-16 ENCOUNTER — Other Ambulatory Visit: Payer: Self-pay | Admitting: *Deleted

## 2011-10-16 DIAGNOSIS — C189 Malignant neoplasm of colon, unspecified: Secondary | ICD-10-CM

## 2011-10-16 MED ORDER — CAPECITABINE 500 MG PO TABS: 1000.0000 mg | ORAL_TABLET | Freq: Two times a day (BID) | ORAL | Status: DC

## 2011-10-16 NOTE — Progress Notes (Signed)
[facility] faxed refill request for xeloda.  Request to MD for review. 

## 2011-10-19 ENCOUNTER — Other Ambulatory Visit (HOSPITAL_BASED_OUTPATIENT_CLINIC_OR_DEPARTMENT_OTHER): Payer: Medicare Other | Admitting: Lab

## 2011-10-19 ENCOUNTER — Ambulatory Visit (HOSPITAL_BASED_OUTPATIENT_CLINIC_OR_DEPARTMENT_OTHER): Payer: Medicare Other | Admitting: Nurse Practitioner

## 2011-10-19 ENCOUNTER — Ambulatory Visit: Payer: Medicare Other | Admitting: Nurse Practitioner

## 2011-10-19 VITALS — BP 116/74 | HR 68 | Temp 97.0°F | Ht 68.5 in | Wt 300.8 lb

## 2011-10-19 DIAGNOSIS — Z79899 Other long term (current) drug therapy: Secondary | ICD-10-CM

## 2011-10-19 DIAGNOSIS — C189 Malignant neoplasm of colon, unspecified: Secondary | ICD-10-CM

## 2011-10-19 DIAGNOSIS — L270 Generalized skin eruption due to drugs and medicaments taken internally: Secondary | ICD-10-CM

## 2011-10-19 DIAGNOSIS — T451X5A Adverse effect of antineoplastic and immunosuppressive drugs, initial encounter: Secondary | ICD-10-CM

## 2011-10-19 DIAGNOSIS — C187 Malignant neoplasm of sigmoid colon: Secondary | ICD-10-CM

## 2011-10-19 LAB — CBC WITH DIFFERENTIAL/PLATELET
Basophils Absolute: 0 10*3/uL (ref 0.0–0.1)
Eosinophils Absolute: 0.1 10*3/uL (ref 0.0–0.5)
HCT: 36.7 % — ABNORMAL LOW (ref 38.4–49.9)
HGB: 12.2 g/dL — ABNORMAL LOW (ref 13.0–17.1)
MCV: 92.8 fL (ref 79.3–98.0)
NEUT#: 2.2 10*3/uL (ref 1.5–6.5)
NEUT%: 53.1 % (ref 39.0–75.0)
RDW: 18.8 % — ABNORMAL HIGH (ref 11.0–14.6)
lymph#: 1.5 10*3/uL (ref 0.9–3.3)

## 2011-10-19 LAB — COMPREHENSIVE METABOLIC PANEL
Albumin: 4 g/dL (ref 3.5–5.2)
BUN: 15 mg/dL (ref 6–23)
Calcium: 9.5 mg/dL (ref 8.4–10.5)
Chloride: 103 mEq/L (ref 96–112)
Creatinine, Ser: 0.97 mg/dL (ref 0.50–1.35)
Glucose, Bld: 92 mg/dL (ref 70–99)
Potassium: 4 mEq/L (ref 3.5–5.3)

## 2011-10-19 NOTE — Progress Notes (Signed)
OFFICE PROGRESS NOTE  Interval history:  Mr. Hennessee returns as scheduled. He began cycle 6 adjuvant Xeloda chemotherapy 10/02/2011. He denies nausea or vomiting. He has a single small mouth sore. He notes a slight "burning" sensation between the toes. He denies diarrhea.   Objective: Blood pressure 116/74, pulse 68, temperature 97 F (36.1 C), temperature source Oral, height 5' 8.5" (1.74 m), weight 300 lb 12.8 oz (136.442 kg).  Oropharynx is without thrush. Tiny ulceration at the left lower anterior gumline. Lungs are clear. No wheezes or rales. Regular cardiac rhythm. Abdomen is soft and nontender. No hepatomegaly. Extremities are without edema. Palms are without erythema or skin breakdown. Soles are without erythema. There is mild dry desquamation at the heels bilaterally. The skin between the toes is intact. There is no skin breakdown.   Lab Results: Lab Results  Component Value Date   WBC 4.1 10/19/2011   HGB 12.2* 10/19/2011   HCT 36.7* 10/19/2011   MCV 92.8 10/19/2011   PLT 199 10/19/2011       Studies/Results: No results found.  Medications: I have reviewed the patient's current medications.  Assessment/Plan:  1. 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 He began adjuvant Xeloda chemotherapy 06/10/2011. He began cycle #3 of adjuvant Xeloda on 07/30/2011 with a dose reduction. He completed cycle 4 beginning 08/21/2011. He completed cycle 5 beginning 09/10/2011. He completed cycle 6 beginning 10/02/2011. 2. Hand-foot syndrome secondary to Xeloda, improved with a dose reduction beginning with cycle #3. 3. Hypertension. 4. Asthma. 5. Legal blindness. 6. Right mid abdominal pain and tenderness, etiology unclear. He has seen Dr. Corliss Skains and is now wearing an abdominal binder. 7. Mouth ulcer-he has a single small ulceration at the left lower anterior gumline.  Disposition-Mr. Keaney appears stable.  Plan to proceed with cycle 7 adjuvant Xeloda beginning 10/23/2011. If he develops progressive mucositis he will discontinue Xeloda and contact the office. He will return for a followup visit 11/09/2011. He will contact the office in the interim with any problems.    Lonna Cobb ANP/GNP-BC

## 2011-10-28 ENCOUNTER — Telehealth: Payer: Self-pay | Admitting: *Deleted

## 2011-10-28 NOTE — Telephone Encounter (Signed)
Ralph Ryan called reporting he is having some burning between his toes.  Denies any redness or discoloration, just burning.  Instructed to keep toes clean, dry, wear white socks and to try an antifungal powder.  Any OTC powder will do but Zeasorb is a better powder.

## 2011-10-29 ENCOUNTER — Telehealth: Payer: Self-pay

## 2011-10-29 NOTE — Telephone Encounter (Signed)
Rec'd call from pt stating that he has had diarrhea x 5 already this morning.  He states he started his chemo pills last Thursday, and did not take them this morning.  Pt states he is drinking a lot of water, denies nausea.  Called pt back and informed him per Dr. Truett Perna, he should hold his xeloda, increase fluids (instructed pt to try gatorade, ginger ale), and use imodium as directed (2 after first BM, then 1 after each subsequent BM, no more than 8/day).  Instructed pt to call office back tomorrow with update.  Pt verbalizes understanding.

## 2011-10-30 ENCOUNTER — Telehealth: Payer: Self-pay | Admitting: *Deleted

## 2011-10-30 NOTE — Telephone Encounter (Signed)
PT. DID NOT STOP HIS XELODA AS WAS ORDERED BY DR.SHERRILL. HE DID DRINK THE GATORADE. PT. HAD FIVE DIARRHEA STOOLS YESTERDAY BUT NONE TODAY. THIS NOTE TO DR.SHERRILL.

## 2011-11-06 ENCOUNTER — Other Ambulatory Visit: Payer: Self-pay | Admitting: *Deleted

## 2011-11-06 ENCOUNTER — Encounter: Payer: Self-pay | Admitting: *Deleted

## 2011-11-06 DIAGNOSIS — C189 Malignant neoplasm of colon, unspecified: Secondary | ICD-10-CM

## 2011-11-06 MED ORDER — CAPECITABINE 500 MG PO TABS: 1000.0000 mg | ORAL_TABLET | Freq: Two times a day (BID) | ORAL | Status: DC

## 2011-11-06 NOTE — Progress Notes (Signed)
RECEIVED A FAX FROM [facility] CONCERNING A CONFIRMATION OF FACSIMILE RECEIPT. 

## 2011-11-06 NOTE — Telephone Encounter (Signed)
THIS REQUEST WAS GIVEN TO DR.SHERRILL'S NURSE, SUSAN COWARD,RN. 

## 2011-11-09 ENCOUNTER — Telehealth: Payer: Self-pay | Admitting: *Deleted

## 2011-11-09 ENCOUNTER — Ambulatory Visit: Payer: Medicare Other | Admitting: Oncology

## 2011-11-09 ENCOUNTER — Telehealth: Payer: Self-pay | Admitting: Oncology

## 2011-11-09 ENCOUNTER — Other Ambulatory Visit: Payer: Self-pay | Admitting: *Deleted

## 2011-11-09 ENCOUNTER — Other Ambulatory Visit: Payer: Medicare Other

## 2011-11-09 NOTE — Telephone Encounter (Signed)
Per Dr. Truett Perna: Reschedule for 11/11/11 at 1130 with [facility]level.

## 2011-11-09 NOTE — Telephone Encounter (Signed)
called pt and provided new appt for 01/23

## 2011-11-09 NOTE — Telephone Encounter (Signed)
Transportation failed to arrive for his appointment today. Needs to reschedule. Next cycle Xeloda due 1/25

## 2011-11-11 ENCOUNTER — Ambulatory Visit (HOSPITAL_BASED_OUTPATIENT_CLINIC_OR_DEPARTMENT_OTHER): Payer: Medicare Other | Admitting: Nurse Practitioner

## 2011-11-11 ENCOUNTER — Telehealth: Payer: Self-pay | Admitting: Oncology

## 2011-11-11 ENCOUNTER — Ambulatory Visit: Payer: Medicare Other | Admitting: Nurse Practitioner

## 2011-11-11 ENCOUNTER — Other Ambulatory Visit (HOSPITAL_BASED_OUTPATIENT_CLINIC_OR_DEPARTMENT_OTHER): Payer: Medicare Other

## 2011-11-11 VITALS — BP 121/75 | HR 75 | Temp 98.5°F | Ht 68.5 in | Wt 306.4 lb

## 2011-11-11 DIAGNOSIS — C187 Malignant neoplasm of sigmoid colon: Secondary | ICD-10-CM

## 2011-11-11 DIAGNOSIS — C189 Malignant neoplasm of colon, unspecified: Secondary | ICD-10-CM

## 2011-11-11 DIAGNOSIS — R197 Diarrhea, unspecified: Secondary | ICD-10-CM

## 2011-11-11 DIAGNOSIS — K055 Other periodontal diseases: Secondary | ICD-10-CM

## 2011-11-11 LAB — COMPREHENSIVE METABOLIC PANEL
Alkaline Phosphatase: 57 U/L (ref 39–117)
CO2: 28 mEq/L (ref 19–32)
Creatinine, Ser: 1.06 mg/dL (ref 0.50–1.35)
Glucose, Bld: 128 mg/dL — ABNORMAL HIGH (ref 70–99)
Sodium: 137 mEq/L (ref 135–145)
Total Bilirubin: 0.5 mg/dL (ref 0.3–1.2)
Total Protein: 7.4 g/dL (ref 6.0–8.3)

## 2011-11-11 LAB — CBC WITH DIFFERENTIAL/PLATELET
Basophils Absolute: 0 10*3/uL (ref 0.0–0.1)
Eosinophils Absolute: 0.1 10*3/uL (ref 0.0–0.5)
HCT: 36.1 % — ABNORMAL LOW (ref 38.4–49.9)
HGB: 11.9 g/dL — ABNORMAL LOW (ref 13.0–17.1)
LYMPH%: 40.9 % (ref 14.0–49.0)
MCV: 91.6 fL (ref 79.3–98.0)
MONO#: 0.3 10*3/uL (ref 0.1–0.9)
MONO%: 7.3 % (ref 0.0–14.0)
NEUT#: 1.9 10*3/uL (ref 1.5–6.5)
NEUT%: 50.1 % (ref 39.0–75.0)
Platelets: 188 10*3/uL (ref 140–400)
RBC: 3.94 10*6/uL — ABNORMAL LOW (ref 4.20–5.82)
WBC: 3.7 10*3/uL — ABNORMAL LOW (ref 4.0–10.3)

## 2011-11-11 NOTE — Progress Notes (Signed)
OFFICE PROGRESS NOTE  Interval history:  Ralph Ryan returns after missing a followup appointment at 11/09/2011. He began cycle 7 of Xeloda 10/23/2011. He reports intermittent loose stools. The last loose stools occurred 2 days ago. He denies nausea/vomiting. He notes a single mouth sore. He denies hand or foot pain or redness.   Objective: Blood pressure 121/75, pulse 75, temperature 98.5 F (36.9 C), temperature source Oral, height 5' 8.5" (1.74 m), weight 306 lb 6.4 oz (138.982 kg).  Tiny healing ulceration at the right lower gumline. Mucous membranes are pink and moist. Lungs are clear. Regular cardiac rhythm. Abdomen is soft and nontender. Obese. Extremities are without edema. Palms are without erythema or skin breakdown. Soles are without erythema. There is mild dry desquamation at the heels bilaterally.  Lab Results: Lab Results  Component Value Date   WBC 3.7* 11/11/2011   HGB 11.9* 11/11/2011   HCT 36.1* 11/11/2011   MCV 91.6 11/11/2011   PLT 188 11/11/2011    Chemistry:    Chemistry      Component Value Date/Time   NA 138 10/19/2011 1102   K 4.0 10/19/2011 1102   CL 103 10/19/2011 1102   CO2 30 10/19/2011 1102   BUN 15 10/19/2011 1102   CREATININE 0.97 10/19/2011 1102      Component Value Date/Time   CALCIUM 9.5 10/19/2011 1102   ALKPHOS 62 10/19/2011 1102   AST 20 10/19/2011 1102   ALT 12 10/19/2011 1102   BILITOT 0.5 10/19/2011 1102       Studies/Results: No results found.  Medications: I have reviewed the patient's current medications.  Assessment/Plan:  1. 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 He began adjuvant Xeloda chemotherapy 06/10/2011. He began cycle #3 of adjuvant Xeloda on 07/30/2011 with a dose reduction. He completed cycle 4 beginning 08/21/2011. He completed cycle 5 beginning 09/10/2011. He completed cycle 6 beginning 10/02/2011. He completed cycle 7 beginning  10/23/2011. 2. Hand-foot syndrome secondary to Xeloda, improved with a dose reduction beginning with cycle #3. 3. Hypertension. 4. Asthma. 5. Legal blindness. 6. Right mid abdominal pain and tenderness, etiology unclear. He has seen Dr. Corliss Skains and is now wearing an abdominal binder. 7. Mouth ulcer-he has a single small ulceration at the right lower gumline. 8. Intermittent loose stools-question related to Xeloda. He was instructed to try Imodium and contact the office if the diarrhea persists despite Imodium.  Disposition-Ralph Ryan appears stable. Plan to proceed with the eighth and final cycle of adjuvant Xeloda beginning 11/12/2011. He will return for a followup visit in one month. He will contact the office the interim as outlined above or with any other problems.  Plan reviewed Dr. Truett Perna.     Ralph Ryan ANP/GNP-BC

## 2011-11-11 NOTE — Telephone Encounter (Signed)
Gv pt appt for feb2013 °

## 2011-11-16 ENCOUNTER — Other Ambulatory Visit: Payer: Self-pay | Admitting: *Deleted

## 2011-11-16 DIAGNOSIS — C189 Malignant neoplasm of colon, unspecified: Secondary | ICD-10-CM

## 2011-11-16 MED ORDER — PROCHLORPERAZINE MALEATE 10 MG PO TABS: 10.0000 mg | ORAL_TABLET | Freq: Four times a day (QID) | ORAL | Status: DC | PRN

## 2011-11-16 NOTE — Telephone Encounter (Signed)
Patient has called office requesting refill on his Compazine. He had called the pharmacy and was told he needed to call his MD office for the refill. Confirmed with pharmacy that the reason they sent him to our office instead of calling for refill was that he never had it filled there. In future, they will contact office for refil.

## 2011-11-20 ENCOUNTER — Telehealth: Payer: Self-pay | Admitting: *Deleted

## 2011-11-20 NOTE — Telephone Encounter (Signed)
Pt called and stated he had noticed burning sensation in mouth;  Skin peeling around mouth and lips since yesterday 11/19/11.  Pt stated slightly red inside and on roof of mouth.   Pt stated no skin peeling any where else. Pt currently takes  Xeloda  1000 mg  BID  X  14  Days  -  Which started on 11/12/11. Dr.  Truett Perna  Notified. Pt's   Phone     [phone].

## 2011-11-20 NOTE — Telephone Encounter (Signed)
Spoke with pt and informed pt re: Per Dr. Truett Perna ,   Pt can either stop Xeloda now , or  Use Biotene mouthwash and  Warm Salt Water gargle to help with burning sensation in mouth.   Pt stated he had used warm salt water gargle after talking to nurse ;  Pt stated he had relief from the salt water mouth rinse.   Instructed pt to call office and let nurse know if pt decides to  STOP  Xeloda.    Pt verbalized understanding.

## 2011-12-07 ENCOUNTER — Telehealth: Payer: Self-pay | Admitting: *Deleted

## 2011-12-07 NOTE — Telephone Encounter (Signed)
Patient asking what to do with the extra Xeloda he has? Should he take it? Instructed him that the cycle he was on 11/12/11 was his 8 th and final cycle. Do not take any more.

## 2011-12-11 ENCOUNTER — Telehealth: Payer: Self-pay | Admitting: *Deleted

## 2011-12-11 NOTE — Telephone Encounter (Signed)
CHECKED WITH DR.SHERRILL'S NURSE, TANYA WHITLOCK,RN. SHE DID NOT CALL THE PT. OR HIS DAUGHTER. NOTIFIED PT. AND REMINDED HIM OF HIS APPOINTMENT ON Monday.

## 2011-12-14 ENCOUNTER — Telehealth: Payer: Self-pay | Admitting: Oncology

## 2011-12-14 ENCOUNTER — Ambulatory Visit: Payer: Medicare Other | Admitting: Oncology

## 2011-12-14 ENCOUNTER — Other Ambulatory Visit: Payer: Medicare Other | Admitting: Lab

## 2011-12-14 NOTE — Telephone Encounter (Signed)
Pt called this morning to r/s 2/25 appt to 4/11. Lm infomring nurse.

## 2011-12-21 ENCOUNTER — Telehealth: Payer: Self-pay | Admitting: *Deleted

## 2011-12-21 NOTE — Telephone Encounter (Signed)
Ralph Ryan called c/o bill for $147.00 he can't pay for xeloda shipment.  Reports [facility] told him "we ordered an extra bottle we shouldn't have ordered, would like to know who ordered this because it is not covered by medicare and I can't pay this".  Reports he took last dose of xeloda on 12-03-11 and stopped it.  Has extra pills today.    Called [facility] and learned Patient received eight shipments of xeloda.  Last shipment was in January.  [employer] in finance says this is his annual deductible  Which is not covered by Medicare.  They will take $5.00/ mos, but he has to pay this because Medicare does not.   Called [facility] and the call was lost.  Message left requesting a return call.  Patient called collaborative nurse.  This nurse has called a second time and calls going straight to voicemail.

## 2011-12-21 NOTE — Telephone Encounter (Signed)
After four call backs was able to reach Ralph Ryan.  Explained this $147.00 bill is not for the last shipment but his annual deductible for all eight shipments.  Also instructed him to call [facility] to make payment arrangements.  He can pay as little as $5.00/mos.  [name] thanked me for my help but wishes someone had told him this when he started this medication.  Explained deductibles vary per person due to type of insurance and the type of medication.

## 2012-01-28 ENCOUNTER — Other Ambulatory Visit: Payer: Medicare Other

## 2012-01-28 ENCOUNTER — Ambulatory Visit (HOSPITAL_BASED_OUTPATIENT_CLINIC_OR_DEPARTMENT_OTHER): Payer: Medicare Other | Admitting: Oncology

## 2012-01-28 ENCOUNTER — Telehealth: Payer: Self-pay | Admitting: Oncology

## 2012-01-28 VITALS — BP 131/76 | HR 57 | Temp 97.2°F | Ht 68.5 in | Wt 317.3 lb

## 2012-01-28 DIAGNOSIS — T451X5A Adverse effect of antineoplastic and immunosuppressive drugs, initial encounter: Secondary | ICD-10-CM

## 2012-01-28 DIAGNOSIS — L270 Generalized skin eruption due to drugs and medicaments taken internally: Secondary | ICD-10-CM

## 2012-01-28 DIAGNOSIS — C189 Malignant neoplasm of colon, unspecified: Secondary | ICD-10-CM

## 2012-01-28 LAB — CBC WITH DIFFERENTIAL/PLATELET
Basophils Absolute: 0 10*3/uL (ref 0.0–0.1)
EOS%: 3.2 % (ref 0.0–7.0)
LYMPH%: 37.4 % (ref 14.0–49.0)
MCH: 30.9 pg (ref 27.2–33.4)
MCV: 92.9 fL (ref 79.3–98.0)
MONO%: 8.9 % (ref 0.0–14.0)
Platelets: 175 10*3/uL (ref 140–400)
RBC: 4 10*6/uL — ABNORMAL LOW (ref 4.20–5.82)
RDW: 14.2 % (ref 11.0–14.6)
nRBC: 0 % (ref 0–0)

## 2012-01-28 NOTE — Progress Notes (Signed)
OFFICE PROGRESS NOTE   INTERVAL HISTORY:   He completed the last cycle of chemotherapy in January. He feels well. No difficulty with bowel or bladder function. He has a good appetite. No specific complaint.  Objective:  Vital signs in last 24 hours:  Blood pressure 131/76, pulse 57, temperature 97.2 F (36.2 C), temperature source Oral, height 5' 8.5" (1.74 m), weight 317 lb 4.8 oz (143.926 kg).    HEENT: No thrush or ulcers Lymphatics: No cervical, supraclavicular, axillary, or inguinal nodes Resp: Lungs clear bilaterally Cardio: Regular rate and rhythm GI: No hepatosplenomegaly, no mass, mild right lower quadrant tenderness Vascular: No leg edema     Lab Results:  Lab Results  Component Value Date   WBC 3.7* 01/28/2012   HGB 12.4* 01/28/2012   HCT 37.2* 01/28/2012   MCV 92.9 01/28/2012   PLT 175 01/28/2012   ANC 1.9   Medications: I have reviewed the patient's current medications.  Assessment/Plan: 1. 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 He began adjuvant Xeloda chemotherapy 06/10/2011. He completed the eighth and final cycle of adjuvant Xeloda chemotherapy beginning on 11/12/2011. 2. Hand-foot syndrome secondary to Xeloda, improved with a dose reduction beginning with cycle #3. 3. Hypertension. 4. Asthma. 5. Legal blindness. 6. Right mid abdominal pain and tenderness, etiology unclear. He has seen Dr. Corliss Skains and is now wearing an abdominal binder.   Disposition:  He appears to be in clinical remission from colon cancer. He will return for an office visit and CEA in 6 months. We will refer him to Dr. Elnoria Howard for a one-year surveillance colonoscopy.   Lucile Shutters, MD  01/28/2012  12:23 PM

## 2012-01-28 NOTE — Telephone Encounter (Signed)
Gv pt appt for oct2013.  DDr. Truett Perna called Dr. Elnoria Howard office lmovm for them to schedule appt for colonoscopy for july or aug2013

## 2012-02-16 ENCOUNTER — Encounter: Payer: Self-pay | Admitting: *Deleted

## 2012-02-16 NOTE — Progress Notes (Signed)
Rx note faxed to [facility] [phone].  "Patient may undergo dental procedures from an oncology standpoint"  Signed by Dr.Sherrill

## 2012-05-17 ENCOUNTER — Telehealth: Payer: Self-pay | Admitting: *Deleted

## 2012-05-17 NOTE — Telephone Encounter (Signed)
Needs letter stating Ralph Ryan has been treated and completed his chemo for his cancer. Call Ralph Ryan #[phone].

## 2012-05-18 ENCOUNTER — Telehealth: Payer: Self-pay | Admitting: *Deleted

## 2012-05-18 NOTE — Telephone Encounter (Signed)
Call from pt's daughter to follow up on request for letter. She needs this for her academic appeal since she had to take time off from school to help take care of her dad. Informed her that letters usually require at least 7 day turn around. She voiced understanding. Requests to be called when complete.

## 2012-05-22 ENCOUNTER — Encounter: Payer: Self-pay | Admitting: Oncology

## 2012-05-23 ENCOUNTER — Encounter: Payer: Self-pay | Admitting: *Deleted

## 2012-07-29 ENCOUNTER — Other Ambulatory Visit: Payer: Medicare Other | Admitting: Lab

## 2012-07-29 ENCOUNTER — Telehealth: Payer: Self-pay | Admitting: Oncology

## 2012-07-29 ENCOUNTER — Ambulatory Visit: Payer: Medicare Other | Admitting: Nurse Practitioner

## 2012-08-22 ENCOUNTER — Telehealth: Payer: Self-pay | Admitting: *Deleted

## 2012-08-22 NOTE — Telephone Encounter (Signed)
Patient reports he had colonoscopy in September per Dr. Elnoria Howard and had #4 polyps removed. Wants to be sure we get the procedure note and pathology for his appointment next week with Lonna Cobb, NP. Will have HIM obtain records.

## 2012-08-25 ENCOUNTER — Ambulatory Visit: Payer: Medicare Other | Admitting: Nurse Practitioner

## 2012-08-25 ENCOUNTER — Ambulatory Visit: Payer: Medicare Other

## 2012-08-25 ENCOUNTER — Other Ambulatory Visit: Payer: Medicare Other | Admitting: Lab

## 2012-08-29 ENCOUNTER — Telehealth: Payer: Self-pay | Admitting: Oncology

## 2012-08-29 NOTE — Telephone Encounter (Signed)
Pt called and r/s missed appt for ML and lab for 09/02/12

## 2012-09-02 ENCOUNTER — Ambulatory Visit (HOSPITAL_BASED_OUTPATIENT_CLINIC_OR_DEPARTMENT_OTHER): Payer: Medicare Other | Admitting: Nurse Practitioner

## 2012-09-02 ENCOUNTER — Other Ambulatory Visit (HOSPITAL_BASED_OUTPATIENT_CLINIC_OR_DEPARTMENT_OTHER): Payer: Medicare Other | Admitting: Lab

## 2012-09-02 ENCOUNTER — Telehealth: Payer: Self-pay | Admitting: Oncology

## 2012-09-02 VITALS — BP 163/94 | HR 79 | Temp 97.9°F | Resp 22 | Ht 68.5 in | Wt 339.9 lb

## 2012-09-02 DIAGNOSIS — C187 Malignant neoplasm of sigmoid colon: Secondary | ICD-10-CM

## 2012-09-02 DIAGNOSIS — R109 Unspecified abdominal pain: Secondary | ICD-10-CM

## 2012-09-02 DIAGNOSIS — C189 Malignant neoplasm of colon, unspecified: Secondary | ICD-10-CM

## 2012-09-02 LAB — CEA: CEA: 1.6 ng/mL (ref 0.0–5.0)

## 2012-09-02 NOTE — Telephone Encounter (Signed)
gv and printed pt appt schedule for May 2014 ° °

## 2012-09-02 NOTE — Progress Notes (Signed)
OFFICE PROGRESS NOTE  Interval history:  Ralph Ryan returns after missing two recent followup visits. He is feeling well. No interim illnesses or infections. He reports undergoing a colonoscopy in August or September of this year with 4 polyps removed. He reports the next colonoscopy is planned at a three-year interval. Bowels overall moving regularly. He has occasional constipation. No hematochezia or melena. He occasionally notes pain at the right mid to lower abdomen. Appetite is "so-so".   Objective: Blood pressure 163/94, pulse 79, temperature 97.9 F (36.6 C), temperature source Oral, resp. rate 22, height 5' 8.5" (1.74 m), weight 339 lb 14.4 oz (154.178 kg).  Oropharynx is without thrush or ulceration. No palpable cervical, supraclavicular or axillary lymph nodes. Small, soft mobile left inguinal lymph node versus irregular fatty tissue. Lungs are clear. Regular cardiac rhythm. Abdomen is soft. Obese. No obvious hepatomegaly. Trace lower leg edema bilaterally. Lab Results: Lab Results  Component Value Date   WBC 3.7* 01/28/2012   HGB 12.4* 01/28/2012   HCT 37.2* 01/28/2012   MCV 92.9 01/28/2012   PLT 175 01/28/2012    Chemistry:    Chemistry      Component Value Date/Time   NA 137 11/11/2011 1048   K 4.2 11/11/2011 1048   CL 102 11/11/2011 1048   CO2 28 11/11/2011 1048   BUN 14 11/11/2011 1048   CREATININE 1.06 11/11/2011 1048      Component Value Date/Time   CALCIUM 9.4 11/11/2011 1048   ALKPHOS 57 11/11/2011 1048   AST 17 11/11/2011 1048   ALT 11 11/11/2011 1048   BILITOT 0.5 11/11/2011 1048       Studies/Results: No results found.  Medications: I have reviewed the patient's current medications.  Assessment/Plan:  1. 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 He began adjuvant Xeloda chemotherapy 06/10/2011. He completed the eighth and final cycle of adjuvant Xeloda chemotherapy beginning on  11/12/2011. 2. Hand-foot syndrome secondary to Xeloda, improved with a dose reduction beginning with cycle #3. 3. Hypertension. 4. Asthma. 5. Legal blindness. 6. Right mid abdominal pain and tenderness, etiology unclear.   Disposition-Ralph Ryan remains in clinical remission from colon cancer. We will followup on the CEA from today. He will return for an office visit and CEA in 6 months. He will contact the office in the interim with any problems.  Plan reviewed with Ralph Ryan.   Ralph Ryan ANP/GNP-BC

## 2012-09-07 ENCOUNTER — Telehealth: Payer: Self-pay | Admitting: *Deleted

## 2012-09-07 NOTE — Telephone Encounter (Signed)
Attempted to reach patient or family at every contact # available.

## 2012-09-07 NOTE — Telephone Encounter (Signed)
Message copied by Wandalee Ferdinand on Wed Sep 07, 2012  1:41 PM ------      Message from: Ladene Artist      Created: Sat Sep 03, 2012  5:19 PM       Please call patient, cea is normal

## 2012-09-07 NOTE — Telephone Encounter (Signed)
Patient had called back leaving message requesting to be called back. Attempted to reach him again at all three contact #'s without success.

## 2012-09-08 ENCOUNTER — Telehealth: Payer: Self-pay | Admitting: *Deleted

## 2012-09-08 NOTE — Telephone Encounter (Signed)
NOTIFIED PT. THAT HIS CEA IS NORMAL. ALSO INFORMED PT. THAT DR.SHERRILL'S NURSE, SUSAN COWARD,RN TRIED TWICE AT THREE DIFFERENT NUMBERS TO CONTACT PT. YESTERDAY. PT. HAS A NEW CELL PHONE NUMBER WHICH WILL BE PUT INTO COMPUTER SYSTEM.

## 2012-09-28 ENCOUNTER — Telehealth: Payer: Self-pay | Admitting: *Deleted

## 2012-09-28 NOTE — Telephone Encounter (Signed)
Call from pt asking if it is OK for him to return to work. Pt is currently not under treatment. YES, instructed him to have forms sent to office if necessary.

## 2012-09-29 ENCOUNTER — Telehealth: Payer: Self-pay | Admitting: *Deleted

## 2012-09-29 NOTE — Telephone Encounter (Signed)
A NOTE WRITTEN ON A PRESCRIPTION PAD AS FOLLOWS-FROM ONCOLOGY STANDPOINT PT. OK TO RETURN TO WORK WAS GIVEN TO PT.

## 2012-09-30 IMAGING — CT CT ABD-PELV W/ CM
2 of 6 series · 17 of 46 positions shown, 19 images · IV contrast (30CC OMNI 300 & [ID] OMNI 300)
Comparison: None.

CLINICAL DATA: Sigmoid colon mass noted on colonoscopy yesterday,
some abdominal pain with nausea and melena

CT ABDOMEN AND PELVIS WITH CONTRAST
TECHNIQUE: Multidetector CT imaging of the abdomen and pelvis was
performed following the standard protocol during bolus
administration of intravenous contrast.
Contrast: 125 ml Hmnipaque-HII

[Series 2: abdomen w/ · axial · 0.92mm/px · z∈[-391,+9]mm · 14 of 90 slices shown, 16 images]
[im 5/90  soft-tissue]
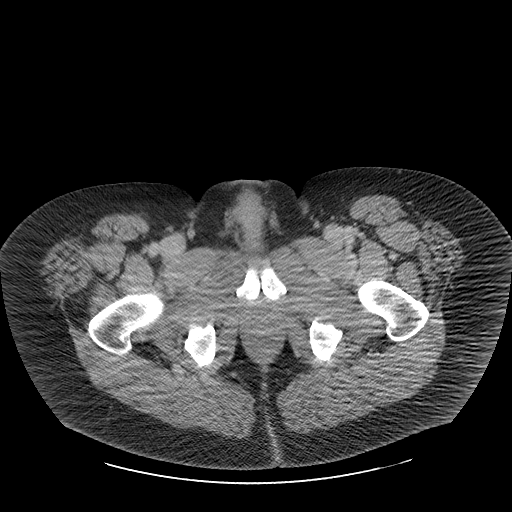
[im 5/90  bone]
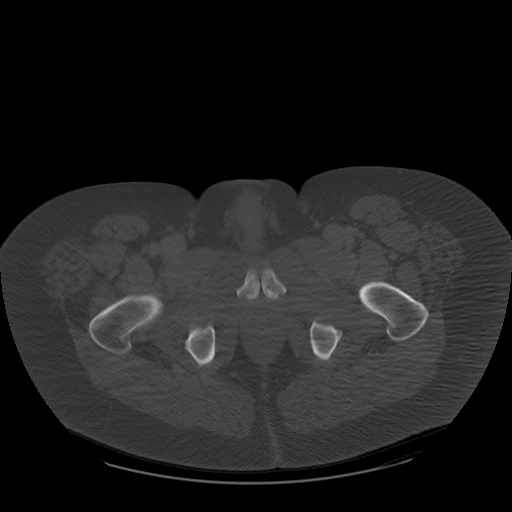
[im 10/90  soft-tissue]
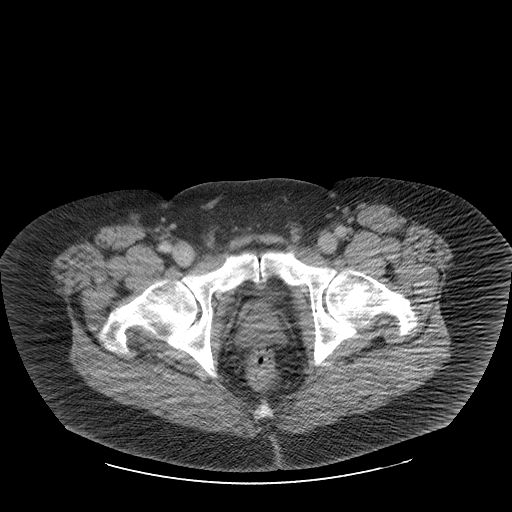
[im 20/90  soft-tissue]
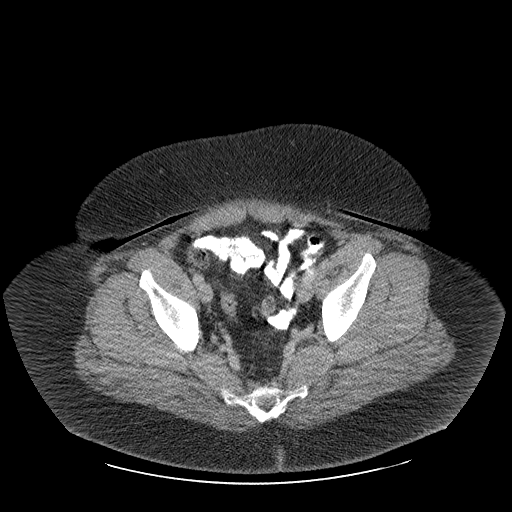
[im 25/90  soft-tissue]
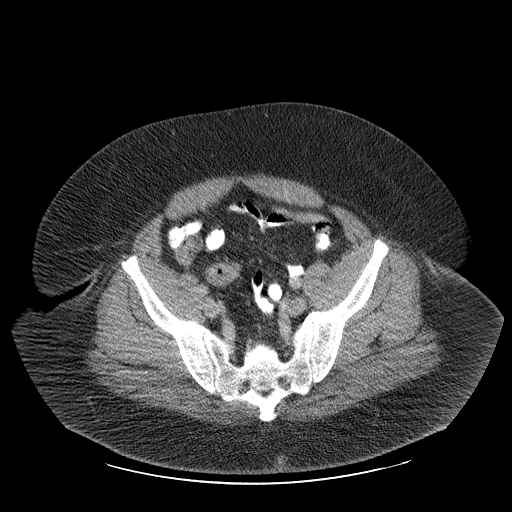
[im 30/90  soft-tissue]
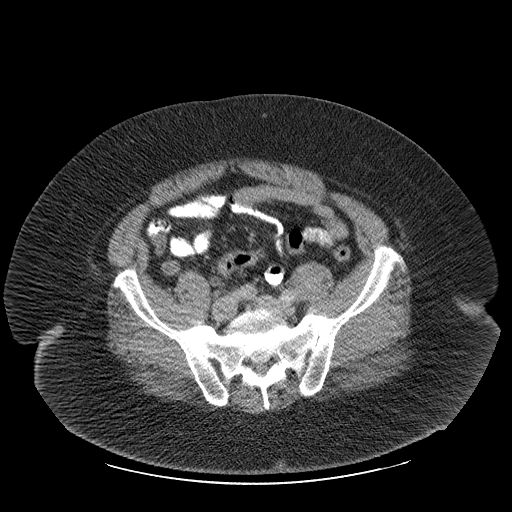
[im 35/90  soft-tissue]
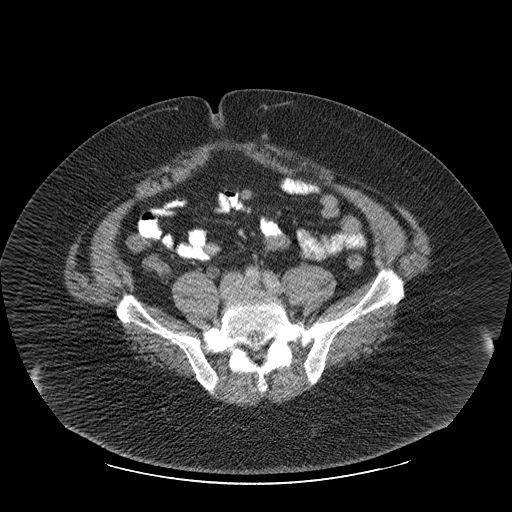
[im 40/90  soft-tissue]
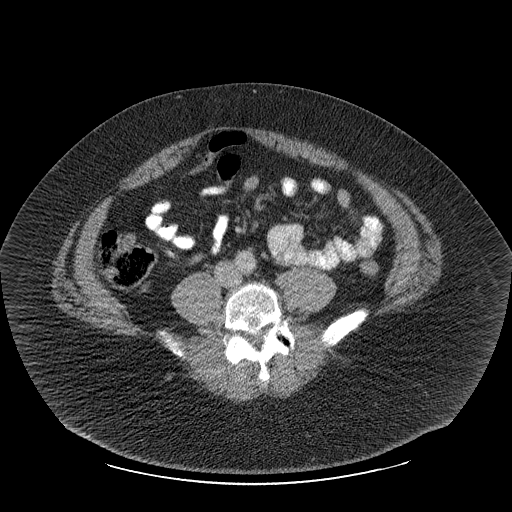
[im 50/90  soft-tissue]
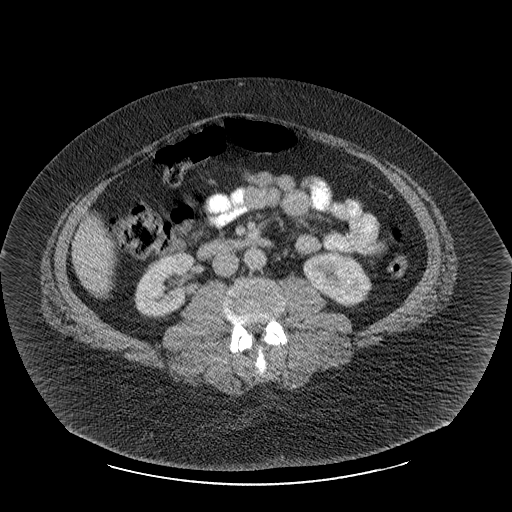
[im 55/90  soft-tissue]
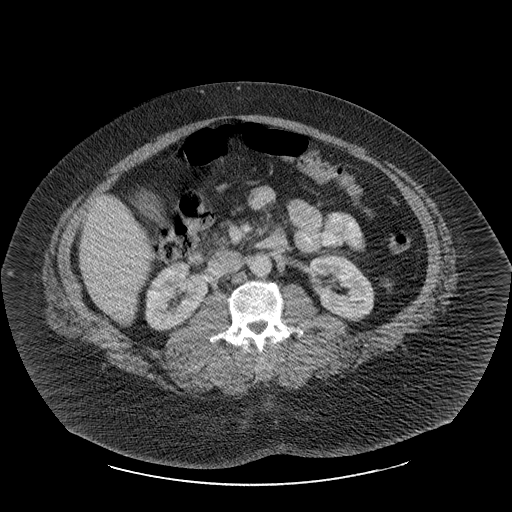
[im 55/90  bone]
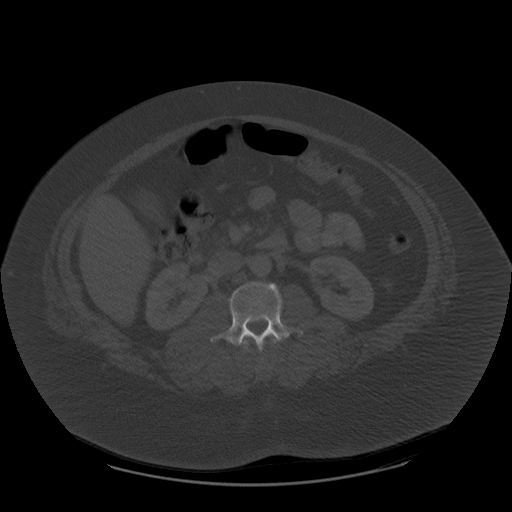
[im 60/90  soft-tissue]
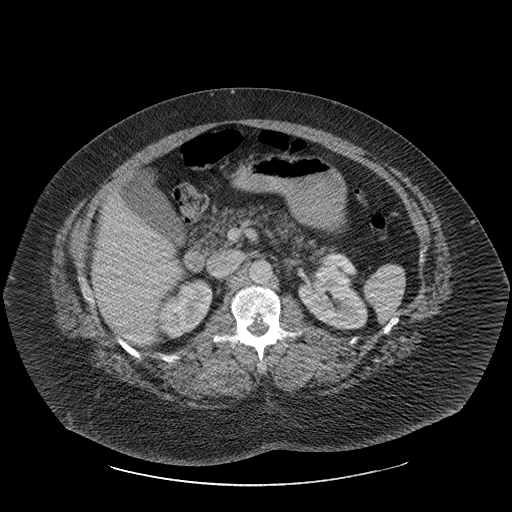
[im 65/90  soft-tissue]
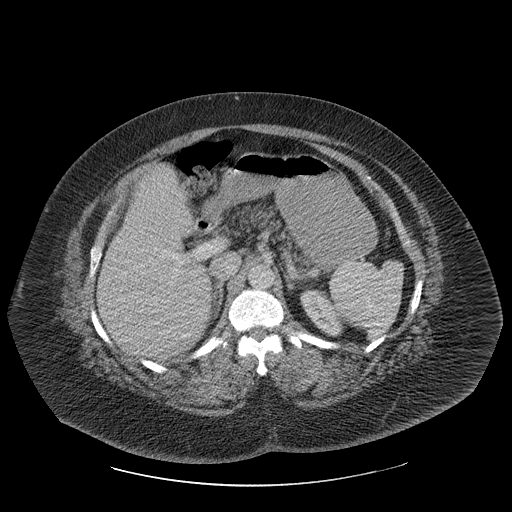
[im 70/90  soft-tissue]
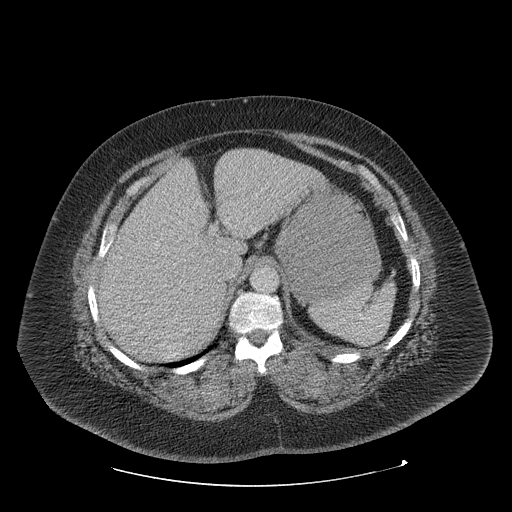
[im 80/90  soft-tissue]
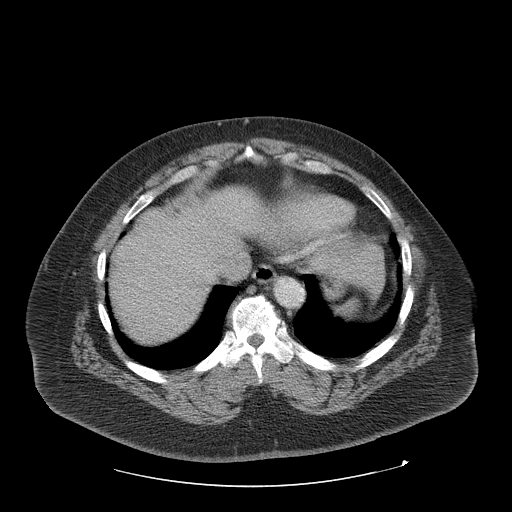
[im 85/90  soft-tissue]
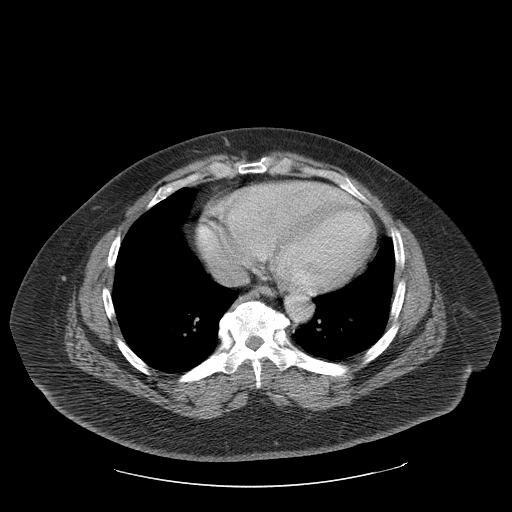

[Series 400: coronal · coronal · 0.97mm/px · 3 of 156 slices shown]
[im 52/156  soft-tissue]
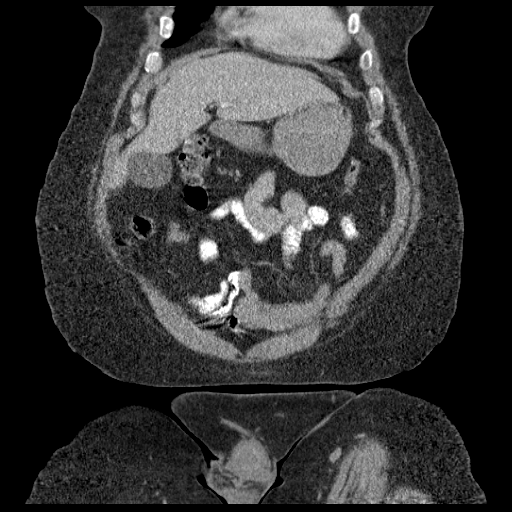
[im 69/156  soft-tissue]
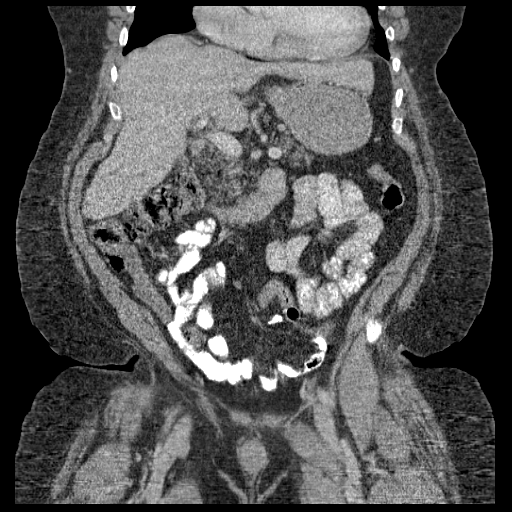
[im 87/156  soft-tissue]
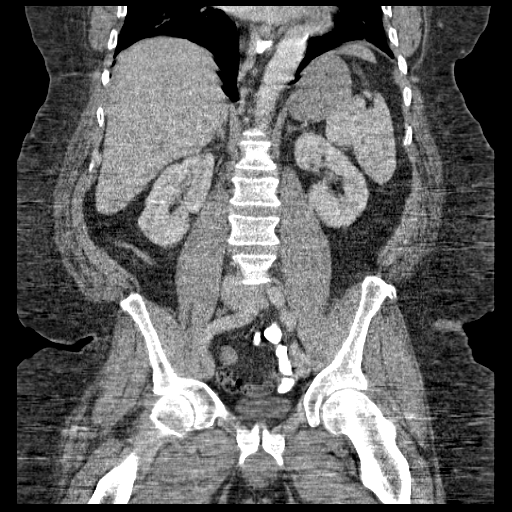

[17 of 46 positions shown; findings below may reference images not displayed]

FINDINGS: The lung bases are clear.  The liver enhances with no
hepatic abnormality noted.  No calcified gallstones are seen.  The
pancreas is fatty infiltrated but no focal abnormality is seen and
no ductal dilatation is noted.  The adrenal glands and spleen are
unremarkable.  The stomach is moderately fluid distended and
unremarkable.  The kidneys enhance with no calculus or mass and no
hydronephrosis is seen.  The abdominal aorta is normal in caliber.
No adenopathy is seen.

The appendix is visualized in the right lower quadrant and is
unremarkable as is the terminal ileum.  There is focal thickening
of the mucosa of the colon near the apex of the sigmoid colon which
may represent the recently diagnosed sigmoid colon carcinoma.
Minimal surrounding strandiness is seen but no adjacent adenopathy
is noted.  No fluid is present within the pelvis.  No bony
abnormality is seen.
IMPRESSION: .
1.  Focal thickening of the mucosa near the apex of the sigmoid
colon may represent the recently diagnosed sigmoid colon carcinoma.
No adjacent adenopathy is seen.
2.  No evidence of metastatic disease within the abdomen or pelvis.

## 2012-10-24 ENCOUNTER — Encounter (HOSPITAL_COMMUNITY): Payer: Self-pay | Admitting: Emergency Medicine

## 2012-10-24 ENCOUNTER — Emergency Department (HOSPITAL_COMMUNITY): Payer: Medicare Other

## 2012-10-24 ENCOUNTER — Emergency Department (HOSPITAL_COMMUNITY)
Admission: EM | Admit: 2012-10-24 | Discharge: 2012-10-24 | Disposition: A | Discharge status: Home / Self Care | Payer: Medicare Other | Attending: Emergency Medicine | Admitting: Emergency Medicine

## 2012-10-24 DIAGNOSIS — Z85038 Personal history of other malignant neoplasm of large intestine: Secondary | ICD-10-CM | POA: Insufficient documentation

## 2012-10-24 DIAGNOSIS — H548 Legal blindness, as defined in USA: Secondary | ICD-10-CM | POA: Insufficient documentation

## 2012-10-24 DIAGNOSIS — I10 Essential (primary) hypertension: Secondary | ICD-10-CM | POA: Insufficient documentation

## 2012-10-24 DIAGNOSIS — Z79899 Other long term (current) drug therapy: Secondary | ICD-10-CM | POA: Insufficient documentation

## 2012-10-24 DIAGNOSIS — J45909 Unspecified asthma, uncomplicated: Secondary | ICD-10-CM | POA: Insufficient documentation

## 2012-10-24 DIAGNOSIS — J029 Acute pharyngitis, unspecified: Secondary | ICD-10-CM | POA: Insufficient documentation

## 2012-10-24 LAB — CBC WITH DIFFERENTIAL/PLATELET
Eosinophils Absolute: 0.1 10*3/uL (ref 0.0–0.7)
Lymphocytes Relative: 26 % (ref 12–46)
Lymphs Abs: 1.4 10*3/uL (ref 0.7–4.0)
Neutrophils Relative %: 62 % (ref 43–77)
Platelets: 218 10*3/uL (ref 150–400)
RBC: 4.28 MIL/uL (ref 4.22–5.81)
WBC: 5.4 10*3/uL (ref 4.0–10.5)

## 2012-10-24 LAB — POCT I-STAT, CHEM 8
Creatinine, Ser: 1.2 mg/dL (ref 0.50–1.35)
Hemoglobin: 13.6 g/dL (ref 13.0–17.0)
Potassium: 3.9 mEq/L (ref 3.5–5.1)
Sodium: 141 mEq/L (ref 135–145)

## 2012-10-24 MED ORDER — PREDNISONE 20 MG PO TABS: ORAL_TABLET | ORAL | Status: DC

## 2012-10-24 MED ORDER — BENZOCAINE (TOPICAL) 20 % EX AERO: INHALATION_SPRAY | Freq: Four times a day (QID) | CUTANEOUS | Status: DC | PRN

## 2012-10-24 MED ORDER — DEXAMETHASONE SODIUM PHOSPHATE 10 MG/ML IJ SOLN
10.0000 mg | Freq: Once | INTRAMUSCULAR | Status: AC
  Administered 2012-10-24: 10 mg via INTRAMUSCULAR
  Filled 2012-10-24: qty 1

## 2012-10-24 MED ORDER — IOHEXOL 300 MG/ML  SOLN: 75.0000 mL | Freq: Once | INTRAMUSCULAR | Status: DC | PRN

## 2012-10-24 MED ORDER — HYDROCODONE-HOMATROPINE 5-1.5 MG/5ML PO SYRP: 5.0000 mL | ORAL_SOLUTION | Freq: Four times a day (QID) | ORAL | Status: DC | PRN

## 2012-10-24 MED ORDER — IOHEXOL 300 MG/ML  SOLN
75.0000 mL | Freq: Once | INTRAMUSCULAR | Status: AC | PRN
  Administered 2012-10-24: 75 mL via INTRAVENOUS

## 2012-10-24 NOTE — ED Notes (Signed)
Pt here with sore throat x 3 days 

## 2012-10-24 NOTE — ED Notes (Signed)
Patient transported to CT 

## 2012-10-27 ENCOUNTER — Emergency Department (HOSPITAL_COMMUNITY)
Admission: EM | Admit: 2012-10-27 | Discharge: 2012-10-28 | Disposition: A | Discharge status: Home / Self Care | Payer: Medicare Other | Attending: Emergency Medicine | Admitting: Emergency Medicine

## 2012-10-27 ENCOUNTER — Encounter (HOSPITAL_COMMUNITY): Payer: Self-pay | Admitting: *Deleted

## 2012-10-27 ENCOUNTER — Emergency Department (HOSPITAL_COMMUNITY): Payer: Medicare Other

## 2012-10-27 DIAGNOSIS — J45909 Unspecified asthma, uncomplicated: Secondary | ICD-10-CM | POA: Insufficient documentation

## 2012-10-27 DIAGNOSIS — Z8719 Personal history of other diseases of the digestive system: Secondary | ICD-10-CM | POA: Insufficient documentation

## 2012-10-27 DIAGNOSIS — R05 Cough: Secondary | ICD-10-CM | POA: Insufficient documentation

## 2012-10-27 DIAGNOSIS — H548 Legal blindness, as defined in USA: Secondary | ICD-10-CM | POA: Insufficient documentation

## 2012-10-27 DIAGNOSIS — Z85038 Personal history of other malignant neoplasm of large intestine: Secondary | ICD-10-CM | POA: Insufficient documentation

## 2012-10-27 DIAGNOSIS — IMO0002 Reserved for concepts with insufficient information to code with codable children: Secondary | ICD-10-CM | POA: Insufficient documentation

## 2012-10-27 DIAGNOSIS — Z79899 Other long term (current) drug therapy: Secondary | ICD-10-CM | POA: Insufficient documentation

## 2012-10-27 DIAGNOSIS — I10 Essential (primary) hypertension: Secondary | ICD-10-CM | POA: Insufficient documentation

## 2012-10-27 DIAGNOSIS — R059 Cough, unspecified: Secondary | ICD-10-CM | POA: Insufficient documentation

## 2012-10-27 NOTE — ED Provider Notes (Signed)
History  This chart was scribed for non-physician practitioner working with Vida Roller, MD by Ardeen Jourdain, ED Scribe. This patient was seen in room TR07C/TR07C and the patient's care was started at 2330.  CSN: [id number]  Arrival date & time 10/27/12  2306   First MD Initiated Contact with Patient 10/27/12 2330      Chief Complaint  Patient presents with  . Cough     The history is provided by the patient. No language interpreter was used.    Ralph Ryan is a 57 y.o. male who presents to the Emergency Department complaining of productive cough with associated hoarse voice that began 3 days ago and has been gradually worsening. He states he has had bronchitis once in the past and these symptoms feel similar. He denies any fever, sore throat, rhinorrhea, congestion, nausea, emesis and diarrhea as associated symptoms. He denies seeing his PCP for the current symptoms. He states his cough is aggravated by talking.  Past Medical History  Diagnosis Date  . Allergy   . Hypertension   . Asthma   . Cancer     colon  . Legal blindness   . Abdominal pain     Past Surgical History  Procedure Date  . Knee surgery   . Hand surgery   . Foot surgery   . Colon surgery   . Colectomy 05/06/11    lap assist sigmoid colectomy    Family History  Problem Relation Age of Onset  . Cancer Mother     History  Substance Use Topics  . Smoking status: Never Smoker   . Smokeless tobacco: Never Used  . Alcohol Use: No      Review of Systems  Constitutional: Negative for fever and chills.  HENT: Positive for voice change. Negative for congestion, sore throat, rhinorrhea and trouble swallowing.   Eyes: Negative.   Respiratory: Positive for cough. Negative for shortness of breath.   Cardiovascular: Negative for chest pain.  Gastrointestinal: Negative for nausea, vomiting and diarrhea.  Genitourinary: Negative.   Skin: Negative for wound.  Neurological: Negative.   All other  systems reviewed and are negative.    Allergies  Codeine; Penicillins; and Aspirin  Home Medications   Current Outpatient Rx  Name  Route  Sig  Dispense  Refill  . AMLODIPINE BESYLATE 10 MG PO TABS   Oral   Take 10 mg by mouth at bedtime.         Marland Kitchen MONTELUKAST SODIUM 10 MG PO TABS   Oral   Take 10 mg by mouth at bedtime.           Marland Kitchen PREDNISONE 20 MG PO TABS   Oral   Take 40 mg by mouth daily.         Marland Kitchen PROCHLORPERAZINE MALEATE 10 MG PO TABS   Oral   Take 10 mg by mouth every 6 (six) hours as needed. For nausea         . HYDROCOD POLST-CPM POLST ER 10-8 MG/5ML PO LQCR   Oral   Take 5 mLs by mouth every 12 (twelve) hours.   140 mL   0     Triage Vitals: BP 134/76  Pulse 84  Temp 98 F (36.7 C)  Resp 20  SpO2 98%  Physical Exam  Nursing note and vitals reviewed. Constitutional: He is oriented to person, place, and time. He appears well-developed and well-nourished. No distress.  HENT:  Head: Normocephalic and atraumatic.  Eyes: EOM are  normal. Pupils are equal, round, and reactive to light.  Neck: Normal range of motion. Neck supple. No tracheal deviation present.  Cardiovascular: Normal rate, regular rhythm and normal heart sounds.  Exam reveals no gallop and no friction rub.   No murmur heard. Pulmonary/Chest: Effort normal and breath sounds normal. No respiratory distress. He has no wheezes. He has no rales. He exhibits no tenderness.  Abdominal: Soft. Bowel sounds are normal. He exhibits no distension.  Musculoskeletal: Normal range of motion. He exhibits no edema.  Neurological: He is alert and oriented to person, place, and time.  Skin: Skin is warm and dry.  Psychiatric: He has a normal mood and affect. His behavior is normal.    ED Course  Procedures (including critical care time)  DIAGNOSTIC STUDIES: Oxygen Saturation is 98% on room air, normal by my interpretation.    COORDINATION OF CARE:  11:41 PM: Discussed treatment plan which  includes a CXR with pt at bedside and pt agreed to plan.     Labs Reviewed - No data to display No results found.   1. Cough       MDM  Bronchitis       I personally performed the services described in this documentation, which was scribed in my presence. The recorded information has been reviewed and is accurate.   Arman Filter, NP 11/02/12 [id number]

## 2012-10-27 NOTE — ED Provider Notes (Signed)
History     CSN: [id number]  Arrival date & time 10/24/12  0754   First MD Initiated Contact with Patient 10/24/12 [phone]      Chief Complaint  Patient presents with  . Sore Throat    (Consider location/radiation/quality/duration/timing/severity/associated sxs/prior treatment) HPI Paitent presents to ED cc sore throat. He states he has associated difficulty breathing and unable to swallow anything.  Patient has had voice change and productive cough.  Denies fevers, chills, myalgias, arthralgias. Denies DOE, SOB, chest tightness or pressure, radiation to left arm, jaw or back, or diaphoresis. Denies dysuria, flank pain, suprapubic pain, frequency, urgency, or hematuria. Denies headaches, light headedness, weakness, visual disturbances. Denies abdominal pain, nausea, vomiting, diarrhea or constipation.   Past Medical History  Diagnosis Date  . Allergy   . Hypertension   . Asthma   . Cancer     colon  . Legal blindness   . Abdominal pain     Past Surgical History  Procedure Date  . Knee surgery   . Hand surgery   . Foot surgery   . Colon surgery   . Colectomy 05/06/11    lap assist sigmoid colectomy    Family History  Problem Relation Age of Onset  . Cancer Mother     History  Substance Use Topics  . Smoking status: Never Smoker   . Smokeless tobacco: Never Used  . Alcohol Use: No      Review of Systems Ten systems reviewed and are negative for acute change, except as noted in the HPI.    Allergies  Codeine; Penicillins; and Aspirin  Home Medications   Current Outpatient Rx  Name  Route  Sig  Dispense  Refill  . AMLODIPINE BESYLATE 10 MG PO TABS   Oral   Take 10 mg by mouth at bedtime.         Marland Kitchen MONTELUKAST SODIUM 10 MG PO TABS   Oral   Take 10 mg by mouth at bedtime.           Marland Kitchen PROCHLORPERAZINE MALEATE 10 MG PO TABS   Oral   Take 10 mg by mouth every 6 (six) hours as needed. For nausea         . BENZOCAINE 20 % EX AERO   Topical   Apply  topically 4 (four) times daily as needed for pain.   57 mL   0   . HYDROCODONE-HOMATROPINE 5-1.5 MG/5ML PO SYRP   Oral   Take 5 mLs by mouth every 6 (six) hours as needed for cough.   120 mL   0   . PREDNISONE 20 MG PO TABS      3 tabs po day one, then 2 po daily x 4 days   11 tablet   0     BP 133/78  Pulse 96  Temp 97.9 F (36.6 C) (Oral)  Resp 18  SpO2 99%  Physical Exam  Nursing note and vitals reviewed. Constitutional: He appears well-developed and well-nourished.  HENT:  Head: Normocephalic and atraumatic.  Mouth/Throat: Uvula is midline. Uvula swelling present. Posterior oropharyngeal edema and posterior oropharyngeal erythema present. No oropharyngeal exudate or tonsillar abscesses.       Hot potato voice  Eyes:       Nystagmus  (chronic)   Lymphadenopathy:       Head (right side): Tonsillar adenopathy present.       Head (left side): Tonsillar adenopathy present.    He has cervical adenopathy.  Right cervical: Superficial cervical adenopathy present.       Left cervical: Superficial cervical adenopathy present.    ED Course  Procedures (including critical care time)  Labs Reviewed  CBC WITH DIFFERENTIAL - Abnormal; Notable for the following:    Hemoglobin 12.6 (*)     HCT 37.5 (*)     All other components within normal limits  POCT I-STAT, CHEM 8 - Abnormal; Notable for the following:    Glucose, Bld 119 (*)     All other components within normal limits  RAPID STREP SCREEN  LAB REPORT - SCANNED   No results found.   CT Soft Tissue Neck W Contrast (Final result)   Result time:10/24/12 1130    Final result by Rad Results In Interface (10/24/12 11:30:06)    Narrative:   *RADIOLOGY REPORT*  Clinical Data: 57 year old male with sore throat and difficulty swallowing times 3 days.  CT NECK WITH CONTRAST  Technique: Multidetector CT imaging of the neck was performed with intravenous contrast.  Contrast: 75mL OMNIPAQUE IOHEXOL 300 MG/ML  SOLN  Comparison: cervical spine radiographs 09/04/2009.  Findings: Large body habitus. Atelectatic changes to the visualized upper lungs and the major airways. No superior mediastinal lymphadenopathy.  Thyromegaly without discrete thyroid nodule or mass. Motion artifact at the larynx. No laryngeal abnormality identified. Motion artifact also at the oropharynx and hypopharynx. Symmetric appearing tonsillar pillar hypertrophy. Adenoid hypertrophy that also appears symmetric. Parapharyngeal spaces, retropharyngeal space and sublingual space are within normal limits. Submandibular glands are within normal limits.  There is increased density in the left parotid gland along the course of the retromandibular vein which is felt to be related to venous collaterals (series 3 image 22 and adjacent). No definite left parotid mass. On the right on the right there is a mildly asymmetric but within normal limits posterior inferior right intraparotid lymph node. There are a small number of asymmetrically enlarged right level IIA lymph nodes, up to 12 mm short axis. Right level IIB nodes are also mildly increased in size and number compared to the left. Other cervical lymph nodes appear more symmetric and within normal limits.  Suboptimal intravascular contrast. No definite vascular abnormality. No fluid collection in the neck. No focal inflammatory stranding identified.  Grossly negative visualized brain parenchyma and orbit soft tissues. Visualized paranasal sinuses and mastoids are clear. No acute osseous abnormality identified.  IMPRESSION: 1. Mild pharyngeal and laryngeal motion artifact. No discrete neck mass or abscess. There is mild right level IIA lymphadenopathy, such as due to acute or recent URI. Recommend repeat neck CT if the patient's symptoms do not resolve. 2. Probable venous vascular confluent in the left parotid. Probable asymmetric but otherwise normal inferior right  parotid lymph node. Thyromegaly without discrete thyroid nodule. Atelectasis in the visible lungs.   Original Report Authenticated By: Erskine Speed, M.D.             DG Chest 2 View (Final result)   Result time:10/24/12 1047    Final result by Rad Results In Interface (10/24/12 10:47:06)    Narrative:   *RADIOLOGY REPORT*  Clinical Data: Sore throat  CHEST - 2 VIEW  Comparison: 06/16/2010  Findings: The patient has taken a poor inspiration. The heart is mildly enlarged. The aorta is unfolded. The lungs are clear. No effusions. No bony abnormalities.  IMPRESSION: Mild cardiomegaly. No active disease evident.   Original Report Authenticated By: Paulina Fusi, M.D.         1. Pharyngitis  MDM  Ct  Obtained to r/o airway compromise.  No absecess. Patient does have edematous tissues in the pharynx. CT and CXR normal. Will give decadron IM and short course of prednisone.  Hurricane spray for sxs relief. patien may f/u with PCP.  At this time there does not appear to be any evidence of an acute emergency medical condition and the patient appears stable for discharge with appropriate outpatient follow up.Diagnosis was discussed with patient who verbalizes understanding and is agreeable to discharge.        Arthor Captain, PA-C 10/27/12 2140

## 2012-10-27 NOTE — ED Notes (Signed)
Pt c/o cough and spitting, intermittent SOB, nausea since yesterday

## 2012-10-28 MED ORDER — HYDROCOD POLST-CHLORPHEN POLST 10-8 MG/5ML PO LQCR: 5.0000 mL | Freq: Two times a day (BID) | ORAL | Status: DC

## 2012-10-28 NOTE — ED Provider Notes (Signed)
Medical screening examination/treatment/procedure(s) were performed by non-physician practitioner and as supervising physician I was immediately available for consultation/collaboration.   Flint Melter, MD 10/28/12 [phone]

## 2012-11-03 NOTE — ED Provider Notes (Signed)
Medical screening examination/treatment/procedure(s) were performed by non-physician practitioner and as supervising physician I was immediately available for consultation/collaboration.    Vida Roller, MD 11/03/12 [phone]

## 2012-11-16 ENCOUNTER — Encounter (INDEPENDENT_AMBULATORY_CARE_PROVIDER_SITE_OTHER): Payer: Self-pay

## 2012-12-05 ENCOUNTER — Encounter (INDEPENDENT_AMBULATORY_CARE_PROVIDER_SITE_OTHER): Payer: Self-pay

## 2013-01-02 ENCOUNTER — Encounter: Payer: Self-pay | Admitting: Oncology

## 2013-01-02 NOTE — Progress Notes (Signed)
Faxed daughter's fmla form to [employer] @ [phone].

## 2013-02-15 ENCOUNTER — Telehealth: Payer: Self-pay | Admitting: Oncology

## 2013-03-02 ENCOUNTER — Other Ambulatory Visit: Payer: Medicare Other | Admitting: Lab

## 2013-03-02 ENCOUNTER — Ambulatory Visit: Payer: Medicare Other | Admitting: Oncology

## 2013-03-21 ENCOUNTER — Ambulatory Visit: Payer: Medicare Other | Admitting: Oncology

## 2013-03-21 ENCOUNTER — Other Ambulatory Visit: Payer: Self-pay | Admitting: *Deleted

## 2013-03-21 ENCOUNTER — Other Ambulatory Visit: Payer: Medicare Other | Admitting: Lab

## 2013-03-21 NOTE — Progress Notes (Signed)
Patient was FTKA for lab/OV today. POF to scheduler to reschedule for 1-2 months.

## 2013-03-22 ENCOUNTER — Telehealth: Payer: Self-pay | Admitting: Oncology

## 2013-03-22 NOTE — Telephone Encounter (Signed)
phone not in service....mailed pt appt sched and letter for July

## 2013-03-24 ENCOUNTER — Telehealth: Payer: Self-pay | Admitting: Oncology

## 2013-03-24 NOTE — Telephone Encounter (Signed)
Pt called and r/s appt to 04/28/13 lab and MD, pt FTKA 6/3 , [place] was in [facility], per pt.

## 2013-04-17 ENCOUNTER — Telehealth: Payer: Self-pay | Admitting: Oncology

## 2013-04-17 NOTE — Telephone Encounter (Signed)
Pt aware of appt for 04/28/13

## 2013-04-20 ENCOUNTER — Telehealth: Payer: Self-pay | Admitting: Oncology

## 2013-04-20 ENCOUNTER — Ambulatory Visit: Payer: Medicare Other | Admitting: Oncology

## 2013-04-20 ENCOUNTER — Other Ambulatory Visit: Payer: Medicare Other | Admitting: Lab

## 2013-04-20 NOTE — Telephone Encounter (Signed)
Mailed letter to pt, called several times no answer, appt r/s to 8/4 from 7/11

## 2013-04-28 ENCOUNTER — Ambulatory Visit: Payer: Medicare Other | Admitting: Oncology

## 2013-04-28 ENCOUNTER — Other Ambulatory Visit: Payer: Medicare Other | Admitting: Lab

## 2013-05-22 ENCOUNTER — Telehealth: Payer: Self-pay | Admitting: Oncology

## 2013-05-22 ENCOUNTER — Ambulatory Visit: Payer: Medicare Other | Admitting: Oncology

## 2013-05-22 ENCOUNTER — Other Ambulatory Visit: Payer: Medicare Other | Admitting: Lab

## 2013-05-22 NOTE — Telephone Encounter (Signed)
Pt called to r/s 8/4 lb/fu to 8/7 @ 3pm. Pt has new d/t.

## 2013-05-25 ENCOUNTER — Telehealth: Payer: Self-pay | Admitting: Oncology

## 2013-05-25 ENCOUNTER — Ambulatory Visit (HOSPITAL_BASED_OUTPATIENT_CLINIC_OR_DEPARTMENT_OTHER): Payer: Medicare Other | Admitting: Oncology

## 2013-05-25 ENCOUNTER — Other Ambulatory Visit: Payer: Medicare Other | Admitting: Lab

## 2013-05-25 VITALS — BP 124/74 | HR 63 | Temp 97.4°F | Resp 20 | Ht 68.5 in | Wt 315.6 lb

## 2013-05-25 DIAGNOSIS — C189 Malignant neoplasm of colon, unspecified: Secondary | ICD-10-CM

## 2013-05-25 NOTE — Progress Notes (Signed)
    [facility]    OFFICE PROGRESS NOTE   INTERVAL HISTORY:   He returns for scheduled appointment. He feels well. No specific complaint. He underwent a colonoscopy in August of 2013  Objective:  Vital signs in last 24 hours:  Blood pressure 124/74, pulse 63, temperature 97.4 F (36.3 C), temperature source Oral, resp. rate 20, height 5' 8.5" (1.74 m), weight 315 lb 9.6 oz (143.155 kg).    HEENT: Neck without mass Lymphatics: No cervical, supra-clavicular, axillary, or inguinal nodes Resp: Lungs clear bilaterally Cardio: Regular rate and rhythm GI: No hepatomegaly, no mass, nontender Vascular: No leg edema   Lab Results:  CEA pending   Medications: I have reviewed the patient's current medications.  Assessment/Plan: 1. 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 He began adjuvant Xeloda chemotherapy 06/10/2011. He completed the eighth and final cycle of adjuvant Xeloda chemotherapy beginning on 11/12/2011. -Surveillance colonoscopy August 2013 with removal of tubular adenomas and a hyperplastic polyp 2. Hand-foot syndrome secondary to Xeloda, improved with a dose reduction beginning with cycle #3. 3. Hypertension. 4. Asthma. 5. Legal blindness.   Disposition:  He remains in clinical remission from colon cancer. He will return for an office visit and CEA in 6 months.   Thornton Papas, MD  05/25/2013  4:08 PM

## 2013-05-25 NOTE — Telephone Encounter (Signed)
gv and printed appt sched and avs for pt  °

## 2013-05-26 LAB — CEA: CEA: 1.7 ng/mL (ref 0.0–5.0)

## 2013-11-24 ENCOUNTER — Telehealth: Payer: Self-pay | Admitting: Oncology

## 2013-11-24 ENCOUNTER — Other Ambulatory Visit: Payer: Medicare Other

## 2013-11-24 ENCOUNTER — Ambulatory Visit (HOSPITAL_BASED_OUTPATIENT_CLINIC_OR_DEPARTMENT_OTHER): Payer: Medicare Other | Admitting: Nurse Practitioner

## 2013-11-24 VITALS — BP 120/83 | HR 57 | Temp 98.5°F | Resp 20 | Ht 68.5 in | Wt 317.0 lb

## 2013-11-24 DIAGNOSIS — C189 Malignant neoplasm of colon, unspecified: Secondary | ICD-10-CM

## 2013-11-24 DIAGNOSIS — I10 Essential (primary) hypertension: Secondary | ICD-10-CM

## 2013-11-24 DIAGNOSIS — C187 Malignant neoplasm of sigmoid colon: Secondary | ICD-10-CM

## 2013-11-24 DIAGNOSIS — L270 Generalized skin eruption due to drugs and medicaments taken internally: Secondary | ICD-10-CM

## 2013-11-24 DIAGNOSIS — H548 Legal blindness, as defined in USA: Secondary | ICD-10-CM

## 2013-11-24 NOTE — Progress Notes (Signed)
OFFICE PROGRESS NOTE  Interval history:  [name] returns for followup of colon cancer. He feels well. No interim illnesses or infections. He remains very active. Appetite is "up and down". He reports his weight is stable. He denies any change in bowel habits. He notes occasional constipation which resolves with apple juice. He has not noticed any bloody or black stools. He denies abdominal pain.   Objective: Filed Vitals:   11/24/13 1007  BP: 120/83  Pulse: 57  Temp: 98.5 F (36.9 C)  Resp: 20   No thrush or ulcerations. No palpable cervical, supraclavicular, axillary or inguinal lymph nodes. Lungs are clear. No wheezes or rales. Regular cardiac rhythm. Abdomen is soft and nontender. No hepatomegaly. No mass. Obese. No leg edema.   Lab Results: Lab Results  Component Value Date   WBC 5.4 10/24/2012   HGB 13.6 10/24/2012   HCT 40.0 10/24/2012   MCV 87.6 10/24/2012   PLT 218 10/24/2012   NEUTROABS 3.3 10/24/2012    Chemistry:    Chemistry      Component Value Date/Time   NA 141 10/24/2012 1025   K 3.9 10/24/2012 1025   CL 102 10/24/2012 1025   CO2 28 11/11/2011 1048   BUN 6 10/24/2012 1025   CREATININE 1.20 10/24/2012 1025      Component Value Date/Time   CALCIUM 9.4 11/11/2011 1048   ALKPHOS 57 11/11/2011 1048   AST 17 11/11/2011 1048   ALT 11 11/11/2011 1048   BILITOT 0.5 11/11/2011 1048     CEA pending.  Studies/Results: No results found.  Medications: I have reviewed the patient's current medications.  Assessment/Plan: 1. 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 He began adjuvant Xeloda chemotherapy 06/10/2011. He completed the eighth and final cycle of adjuvant Xeloda chemotherapy beginning on 11/12/2011. -Surveillance colonoscopy August 2013 with removal of tubular adenomas and a hyperplastic polyp.  2. Hand-foot syndrome secondary to Xeloda, improved with a dose reduction beginning with cycle  #3. 3. Hypertension. 4. Asthma. 5. Legal blindness.  Dispositon-he remains in clinical remission from colon cancer. He will return for a followup visit and CEA in 6 months.   Ned Card ANP/GNP-BC

## 2013-11-24 NOTE — Telephone Encounter (Signed)
Gave pt appt for lab and md for August 2015 °

## 2013-11-25 LAB — CEA: CEA: 1.4 ng/mL (ref 0.0–5.0)

## 2013-11-27 ENCOUNTER — Telehealth: Payer: Self-pay | Admitting: *Deleted

## 2013-11-27 NOTE — Telephone Encounter (Signed)
Patient notified of CEA results 

## 2013-11-27 NOTE — Telephone Encounter (Signed)
Message copied by Norma Fredrickson on Mon Nov 27, 2013  9:30 AM ------      Message from: Ralph Ryan      Created: Sun Nov 26, 2013  2:12 PM       Please call patient, cea is normal ------

## 2013-11-27 NOTE — Telephone Encounter (Signed)
Message copied by Tania Ade on Mon Nov 27, 2013  1:38 PM ------      Message from: Ladell Pier      Created: Sun Nov 26, 2013  2:12 PM       Please call patient, cea is normal ------

## 2013-11-27 NOTE — Telephone Encounter (Signed)
Left Message for patient to call back regarding lab results.   

## 2014-05-16 ENCOUNTER — Other Ambulatory Visit: Payer: Self-pay | Admitting: *Deleted

## 2014-05-17 ENCOUNTER — Telehealth: Payer: Self-pay | Admitting: Oncology

## 2014-05-17 ENCOUNTER — Other Ambulatory Visit: Payer: Self-pay | Admitting: *Deleted

## 2014-05-17 NOTE — Telephone Encounter (Signed)
Lft msg for pt per 07/30 POF schedule change, mailed change to pt.Ralph Ryan..KJ

## 2014-05-25 ENCOUNTER — Other Ambulatory Visit: Payer: Medicare Other

## 2014-05-25 ENCOUNTER — Ambulatory Visit: Payer: Medicare Other | Admitting: Oncology

## 2014-06-13 ENCOUNTER — Other Ambulatory Visit: Payer: Medicare Other

## 2014-06-13 ENCOUNTER — Telehealth: Payer: Self-pay | Admitting: Oncology

## 2014-06-13 ENCOUNTER — Telehealth: Payer: Self-pay | Admitting: *Deleted

## 2014-06-13 ENCOUNTER — Ambulatory Visit (HOSPITAL_BASED_OUTPATIENT_CLINIC_OR_DEPARTMENT_OTHER): Payer: Medicare Other | Admitting: Oncology

## 2014-06-13 VITALS — BP 136/68 | HR 60 | Temp 98.0°F | Resp 20 | Ht 68.5 in | Wt 308.5 lb

## 2014-06-13 DIAGNOSIS — C189 Malignant neoplasm of colon, unspecified: Secondary | ICD-10-CM

## 2014-06-13 DIAGNOSIS — Z85038 Personal history of other malignant neoplasm of large intestine: Secondary | ICD-10-CM

## 2014-06-13 LAB — CEA: CEA: 2.1 ng/mL (ref 0.0–5.0)

## 2014-06-13 NOTE — Telephone Encounter (Signed)
gv and printed appt sched and avs for opt for Feb 2016

## 2014-06-13 NOTE — Telephone Encounter (Signed)
Per Dr. Benay Spice; notified pt cea is normal.  Pt verbalized understanding.

## 2014-06-13 NOTE — Progress Notes (Signed)
  [facility] OFFICE PROGRESS NOTE   Diagnosis: Colon cancer  INTERVAL HISTORY:   Ralph Ryan returns as scheduled. He reports that he no longer has a primary physician. He has no difficulty with bowel function. Intermittently he has a poor appetite. No other complaint. He recently had a tooth extraction.  Objective:  Vital signs in last 24 hours:  Blood pressure 136/68, pulse 60, temperature 98 F (36.7 C), temperature source Oral, resp. rate 20, height 5' 8.5" (1.74 m), weight 308 lb 8 oz (139.935 kg).    HEENT: Neck without mass, slight prominence of the right compared to the left submandibular gland, oral cavity without visible mass Lymphatics: No cervical, supraclavicular, axillary, or inguinal nodes Resp: Lungs clear bilaterally Cardio: Regular rate and rhythm GI: No hepatomegaly, nontender, no mass Vascular: No leg edema  Lab Results  Component Value Date   CEA 1.4 11/24/2013    Medications: I have reviewed the patient's current medications.  Assessment/Plan: 1. 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 He began adjuvant Xeloda chemotherapy 06/10/2011. He completed the eighth and final cycle of adjuvant Xeloda chemotherapy beginning on 11/12/2011. -Surveillance colonoscopy August 2013 with removal of tubular adenomas and a hyperplastic polyp.  2. Hand-foot syndrome secondary to Xeloda, improved with a dose reduction beginning with cycle #3. 3. Hypertension. 4. Asthma. 5. Legal blindness.   Disposition:  He remains in clinical remission from colon cancer. He will return for an office visit and CEA in 6 months. I recommended he obtain a primary physician. He will be due for a colonoscopy with Dr. Benson Norway in August of 2016. He will contact us for consistent anorexia.  Betsy Coder, MD  06/13/2014  11:34 AM

## 2014-06-13 NOTE — Telephone Encounter (Signed)
Message copied by Domenic Schwab on Wed Jun 13, 2014  5:08 PM ------      Message from: Betsy Coder B      Created: Wed Jun 13, 2014  2:27 PM       Please call patient. cea is normal ------

## 2014-08-07 ENCOUNTER — Encounter (HOSPITAL_COMMUNITY): Payer: Self-pay | Admitting: Emergency Medicine

## 2014-08-07 ENCOUNTER — Emergency Department (HOSPITAL_COMMUNITY)
Admission: EM | Admit: 2014-08-07 | Discharge: 2014-08-07 | Disposition: A | Discharge status: Home / Self Care | Payer: Medicare Other | Attending: Emergency Medicine | Admitting: Emergency Medicine

## 2014-08-07 DIAGNOSIS — J45909 Unspecified asthma, uncomplicated: Secondary | ICD-10-CM | POA: Insufficient documentation

## 2014-08-07 DIAGNOSIS — S39012A Strain of muscle, fascia and tendon of lower back, initial encounter: Secondary | ICD-10-CM | POA: Diagnosis not present

## 2014-08-07 DIAGNOSIS — M545 Low back pain: Secondary | ICD-10-CM | POA: Diagnosis present

## 2014-08-07 DIAGNOSIS — I10 Essential (primary) hypertension: Secondary | ICD-10-CM | POA: Diagnosis not present

## 2014-08-07 DIAGNOSIS — Z79899 Other long term (current) drug therapy: Secondary | ICD-10-CM | POA: Insufficient documentation

## 2014-08-07 DIAGNOSIS — Z880 Allergy status to penicillin: Secondary | ICD-10-CM | POA: Diagnosis not present

## 2014-08-07 DIAGNOSIS — X58XXXA Exposure to other specified factors, initial encounter: Secondary | ICD-10-CM | POA: Diagnosis not present

## 2014-08-07 DIAGNOSIS — H548 Legal blindness, as defined in USA: Secondary | ICD-10-CM | POA: Insufficient documentation

## 2014-08-07 DIAGNOSIS — Y9389 Activity, other specified: Secondary | ICD-10-CM | POA: Insufficient documentation

## 2014-08-07 DIAGNOSIS — Z85038 Personal history of other malignant neoplasm of large intestine: Secondary | ICD-10-CM | POA: Insufficient documentation

## 2014-08-07 DIAGNOSIS — Y9289 Other specified places as the place of occurrence of the external cause: Secondary | ICD-10-CM | POA: Diagnosis not present

## 2014-08-07 MED ORDER — CYCLOBENZAPRINE HCL 10 MG PO TABS: 10.0000 mg | ORAL_TABLET | Freq: Two times a day (BID) | ORAL | Status: DC | PRN

## 2014-08-07 MED ORDER — NAPROXEN 500 MG PO TABS: 500.0000 mg | ORAL_TABLET | Freq: Two times a day (BID) | ORAL | Status: DC

## 2014-08-07 NOTE — ED Notes (Signed)
Pt ambulated from wheelchair and bed w/o difficulty.

## 2014-08-07 NOTE — ED Provider Notes (Signed)
CSN: [id number]     Arrival date & time 08/07/14  1020 History   First MD Initiated Contact with Patient 08/07/14 1103     Chief Complaint  Patient presents with  . Back Pain     (Consider location/radiation/quality/duration/timing/severity/associated sxs/prior Treatment) HPI The patient reports that he had been moving furniture several weeks ago. He believes he strained his back. He reports he's had tightness and aching in his back since then. It's worse with particular movements particularly turning his head side to side or bending forward or twisting. He reports that the lower back pain radiates from the center somewhat out to the right lower back. He has not been able to try anything for pain relief. He denies any weakness numbness or tingling to the legs. No bowel or bladder difficulties. No associated abdominal pain. No associated chest pain shortness of breath or cough. He reports he has had lower back problems since a distant injury.  Past Medical History  Diagnosis Date  . Allergy   . Hypertension   . Asthma   . Cancer     colon  . Legal blindness   . Abdominal pain    Past Surgical History  Procedure Laterality Date  . Knee surgery    . Hand surgery    . Foot surgery    . Colon surgery    . Colectomy  05/06/11    lap assist sigmoid colectomy   Family History  Problem Relation Age of Onset  . Cancer Mother    History  Substance Use Topics  . Smoking status: Never Smoker   . Smokeless tobacco: Never Used  . Alcohol Use: No    Review of Systems 10 Systems reviewed and are negative for acute change except as noted in the HPI.    Allergies  Codeine; Penicillins; and Aspirin  Home Medications   Prior to Admission medications   Medication Sig Start Date End Date Taking? Authorizing Provider  cetirizine (ZYRTEC) 10 MG tablet Take 10 mg by mouth daily.    Charolotte Eke Powers  cyclobenzaprine (FLEXERIL) 10 MG tablet Take 1 tablet (10 mg total) by mouth 2 (two)  times daily as needed for muscle spasms. 08/07/14   Charlesetta Shanks, MD  lisinopril (PRINIVIL,ZESTRIL) 10 MG tablet Take 10 mg by mouth daily. 05/18/13   Charolotte Eke Powers  naproxen (NAPROSYN) 500 MG tablet Take 1 tablet (500 mg total) by mouth 2 (two) times daily. 08/07/14   Charlesetta Shanks, MD  omeprazole (PRILOSEC) 20 MG capsule Take 20 mg by mouth daily.    Stephanie J Powers  PATADAY 0.2 % SOLN Apply 1 drop to eye as directed. Both eyes 05/18/13   Historical Provider, MD   BP 131/70  Pulse 78  Temp(Src) 97.8 F (36.6 C) (Oral)  Resp 18  SpO2 99% Physical Exam  Constitutional: He is oriented to person, place, and time.  This is a morbidly obese gentleman. He is well in appearance. He is sitting up at the edge of the stretcher. No respiratory distress.  HENT:  Head: Normocephalic and atraumatic.  Eyes: EOM are normal. Pupils are equal, round, and reactive to light.  Neck: Normal range of motion. Neck supple.  Patient reports he does have pain at the bases neck however he does spontaneously illustrate full range of motion turning his head from side to side and forward flexion. He identifies that is exacerbating his pain however  Cardiovascular: Normal rate, regular rhythm, normal heart sounds and intact distal pulses.  Pulmonary/Chest: Effort normal and breath sounds normal.  Abdominal: Soft. Bowel sounds are normal. He exhibits no distension. There is no tenderness.  Musculoskeletal: Normal range of motion. He exhibits no edema.  Patient endorses tenderness to palpation over the entirety of his spine. He does note some degree of more intense discomfort in his lower back into the right over the sacroiliac region. I did have the patient stand and ambulate. There is no weakness he can transition from sitting to standing without difficulty and ambulate.  Neurological: He is alert and oriented to person, place, and time. He has normal strength. Coordination normal. GCS eye subscore is 4. GCS  verbal subscore is 5. GCS motor subscore is 6.  Lower extremity strength testing is 5 out of 5 in flexion and extension. Sensation is intact to light touch.  Skin: Skin is warm, dry and intact.  Psychiatric: He has a normal mood and affect.    ED Course  Procedures (including critical care time) Labs Review Labs Reviewed - No data to display  Imaging Review No results found.   EKG Interpretation None      MDM   Final diagnoses:  Back strain, initial encounter   Patient identifies an activity that has exacerbated back pain. This back pain has no associated symptoms and no associated neurologic dysfunction. It is reproducible with movement and palpation. At this point time this most consistent with mechanical strain. The patient will be started on anti-inflammatories and muscle relaxers. He is counseled to followup with family physician for further monitoring of response to treatment and if any worsening or changing to return to the emergency department.    Charlesetta Shanks, MD 08/07/14 1124

## 2014-08-07 NOTE — Discharge Instructions (Signed)
Back Pain, Adult °Low back pain is very common. About 1 in 5 people have back pain. The cause of low back pain is rarely dangerous. The pain often gets better over time. About half of people with a sudden onset of back pain feel better in just 2 weeks. About 8 in 10 people feel better by 6 weeks.  °CAUSES °Some common causes of back pain include: °· Strain of the muscles or ligaments supporting the spine. °· Wear and tear (degeneration) of the spinal discs. °· Arthritis. °· Direct injury to the back. °DIAGNOSIS °Most of the time, the direct cause of low back pain is not known. However, back pain can be treated effectively even when the exact cause of the pain is unknown. Answering your caregiver's questions about your overall health and symptoms is one of the most accurate ways to make sure the cause of your pain is not dangerous. If your caregiver needs more information, he or she may order lab work or imaging tests (X-rays or MRIs). However, even if imaging tests show changes in your back, this usually does not require surgery. °HOME CARE INSTRUCTIONS °For many people, back pain returns. Since low back pain is rarely dangerous, it is often a condition that people can learn to manage on their own.  °· Remain active. It is stressful on the back to sit or stand in one place. Do not sit, drive, or stand in one place for more than 30 minutes at a time. Take short walks on level surfaces as soon as pain allows. Try to increase the length of time you walk each day. °· Do not stay in bed. Resting more than 1 or 2 days can delay your recovery. °· Do not avoid exercise or work. Your body is made to move. It is not dangerous to be active, even though your back may hurt. Your back will likely heal faster if you return to being active before your pain is gone. °· Pay attention to your body when you  bend and lift. Many people have less discomfort when lifting if they bend their knees, keep the load close to their bodies, and  avoid twisting. Often, the most comfortable positions are those that put less stress on your recovering back. °· Find a comfortable position to sleep. Use a firm mattress and lie on your side with your knees slightly bent. If you lie on your back, put a pillow under your knees. °· Only take over-the-counter or prescription medicines as directed by your caregiver. Over-the-counter medicines to reduce pain and inflammation are often the most helpful. Your caregiver may prescribe muscle relaxant drugs. These medicines help dull your pain so you can more quickly return to your normal activities and healthy exercise. °· Put ice on the injured area. °¨ Put ice in a plastic bag. °¨ Place a towel between your skin and the bag. °¨ Leave the ice on for 15-20 minutes, 03-04 times a day for the first 2 to 3 days. After that, ice and heat may be alternated to reduce pain and spasms. °· Ask your caregiver about trying back exercises and gentle massage. This may be of some benefit. °· Avoid feeling anxious or stressed. Stress increases muscle tension and can worsen back pain. It is important to recognize when you are anxious or stressed and learn ways to manage it. Exercise is a great option. °SEEK MEDICAL CARE IF: °· You have pain that is not relieved with rest or medicine. °· You have pain that does not improve in 1 week. °· You have new symptoms. °· You are generally not feeling well. °SEEK   IMMEDIATE MEDICAL CARE IF:   You have pain that radiates from your back into your legs.  You develop new bowel or bladder control problems.  You have unusual weakness or numbness in your arms or legs.  You develop nausea or vomiting.  You develop abdominal pain.  You feel faint. Document Released: 10/05/2005 Document Revised: 04/05/2012 Document Reviewed: 02/06/2014 [facility] Patient Information 2015 [place], [place]. This information is not intended to replace advice given to you by your health care provider. Make sure you  discuss any questions you have with your health care provider.  Emergency Department Resource Guide 1) Find a Doctor and Pay Out of Pocket Although you won't have to find out who is covered by your insurance plan, it is a good idea to ask around and get recommendations. You will then need to call the office and see if the doctor you have chosen will accept you as a new patient and what types of options they offer for patients who are self-pay. Some doctors offer discounts or will set up payment plans for their patients who do not have insurance, but you will need to ask so you aren't surprised when you get to your appointment.  2) Contact Your Local Health Department Not all health departments have doctors that can see patients for sick visits, but many do, so it is worth a call to see if yours does. If you don't know where your local health department is, you can check in your phone book. The CDC also has a tool to help you locate your state's health department, and many state websites also have listings of all of their local health departments.  3) Find a [facility] If your illness is not likely to be very severe or complicated, you may want to try a walk in clinic. These are popping up all over the country in pharmacies, drugstores, and shopping centers. They're usually staffed by nurse practitioners or physician assistants that have been trained to treat common illnesses and complaints. They're usually fairly quick and inexpensive. However, if you have serious medical issues or chronic medical problems, these are probably not your best option.  No Primary Care Doctor: - Call Health Connect at  [phone] - they can help you locate a primary care doctor that  accepts your insurance, provides certain services, etc. - Physician Referral Service- [phone]  Chronic Pain Problems: Organization         Address  Phone   Notes  [facility]  [phone] Patients need to be  referred by their primary care doctor.   Medication Assistance: Organization         Address  Phone   Notes  [facility] Medication [facility] [address] [place], [place] [address] [phone] --Must be a resident of [facility] -- Must have NO insurance coverage whatsoever (no Medicaid/ Medicare, etc.) -- The pt. MUST have a primary care doctor that directs their care regularly and follows them in the community   MedAssist  [phone]   [employer]  [phone]    Agencies that provide inexpensive medical care: Organization         Address  Phone   Notes  [place]  [phone]   [facility]    [phone]   [facility] [place], [place] [address] [phone]   [place]inita [place] (  [phone]   Planned Parenthood    [phone]   [facility]    [phone]   [place] [address], [place] Phone:  [phone], Fax:  [phone] Hours of Operation:  9 am - 6 pm, M-F.  Also accepts Medicaid/Medicare and self-pay.  [facility] for [place] [address], [address], [place] Phone: [phone], Fax: [phone]. Hours of Operation:  8:30 am - 5:30 pm, M-F.  Also accepts Medicaid and self-pay.  [facility] [address] [place] Phone: [phone]   [place], [place], [place] [phone], Ext. [phone] Mondays & Thursdays: 7-9 AM.  First 15 patients are seen on a first come, first serve basis.    [place] Providers:  Organization         Address  Phone   Notes  [facility] [address], [place] [phone] Also accepts self-pay patients.  [facility] [address], [place]  [phone]   [place], [address], [place] [phone]   [facility] [address], [place] [phone]   Lucianne Lei [address], [place]   [phone] Only accepts [place] Access [place] patients after they have their name applied to their card.   Self-Pay (no insurance) in [facility]:  Organization         Address  Phone   Notes  Sickle Cell Patients, [facility] [place] [phone]   [facility] [place] [phone]   [facility] [place]  [place], [place], [place] [phone]   [facility]/Dr. Osei-Bonsu  [address], [place] or [address], [place] [phone] Phone number for both [place] and [place] locations is the same.  [facility] and [facility] [address], [place] [phone]   [facility] [address] [place] or [address] [phone]   [facility] [address], [place] [phone], phone; [phone], fax Sees patients 1st and 3rd Saturday of every month.  Must not qualify for public or private insurance (i.e. Medicaid, Medicare, [place] Health Choice, Veterans' Benefits)  Household income should be no more than 200% of the poverty level The clinic cannot treat you if you are pregnant or think you are pregnant  Sexually transmitted diseases are not treated at the clinic.    Dental Care: Organization         Address  Phone  Notes  [facility] Department of [facility] [place] [phone] Accepts children up to age 60 who are enrolled in [place] or [place]; pregnant women with a Medicaid card; and children who have applied for Medicaid or [place] Health Choice, but were declined, whose parents can  pay a reduced fee at time of service.  [facility] Department of [facility]  [address], [place] [phone] Accepts children up to age 63 who are enrolled in [place] or [place]; pregnant women with a Medicaid card; and children who have applied for Medicaid or [place]  Choice, but were declined, whose parents can pay a reduced fee at time of service.  [place] Adult Dental Access PROGRAM  [place] [phone] Patients are seen by appointment only. Walk-ins are not accepted. [place] will see patients 72 years of age and older. Monday - Tuesday (8am-5pm) Most Wednesdays (8:30-5pm) $30 per visit, cash only  [facility] Adult Dental Access PROGRAM  [address], [facility] [phone] Patients are seen by appointment only. Walk-ins are not accepted. [place] will see patients 70 years of age and older. One Wednesday Evening (Monthly: Volunteer Based).  $30 per visit, cash only  [place]  [phone] for adults; Children under age 35, call Graduate Pediatric Dentistry at [phone]. Children aged 52-14, please call [phone] to request a pediatric application.  Dental services are provided in all areas of dental care including fillings, crowns and bridges, complete and partial dentures, implants, gum treatment, root canals, and extractions. Preventive care is also provided. Treatment is provided to both adults and children. Patients are selected via a lottery and there is often a waiting list.   [facility] [address] [place]  [phone] [email or web address]   [facility] [address] [place], [place] [phone], Ext. [phone] Second and Fourth Thursday of each month, opens at 6:30 AM; Clinic ends at 9 AM.  Patients are seen on a first-come first-served basis, and a limited number are seen during each clinic.   [facility]  [address] Hillard Danker [place], [place] [phone]   Eligibility Requirements You must have lived in [place], [place], or [place] counties for at least the last three months.   You cannot be eligible for state or federal sponsored [employer], including [employer], [place], or [employer].   You generally cannot be eligible for healthcare insurance through your employer.    How to apply: Eligibility screenings are held every Tuesday and Wednesday afternoon from 1:00 pm until 4:00 pm. You do not need an appointment for the interview!  [facility] [address] [place], [place]   [place]  [place] Department  [place]ise  [phone]    Behavioral Health Resources in the Community: Intensive Outpatient Programs Organization         Address  Phone  Notes  [facility] [address], [place], [place] [phone]   [facility]patient [address], [place], [place]   ADS: Alcohol & Drug Svcs [address], [place], [place]   [place] [address]  [place], [place] or [phone]   Substance Abuse Resources Organization         Address  Phone  Notes  Alcohol and Drug Services  [phone]   [place]  [phone]   [facility] [place]   Chinita Pester  [phone]   Residential & Outpatient Substance Abuse Program  [phone]   Psychological Services Organization         Address  Phone  Notes  [facility] [place]  [place]  [phone]   [place] [address], [place] or [phone]    Mobile Crisis Teams Organization         Address  Phone  Notes  [facility], Mobile Crisis Care Unit  [phone]    [facility]ive Psychotherapeutic Services  [address] [place], [place]   [facility] [address] [place] [phone]    Self-Help/Support Groups Organization         Address  Phone             Notes  [place]. of [place] - variety of support groups  [place] Call for more information  Narcotics Anonymous (NA), [facility] [address], [employer]   2 meetings at this location   Special educational needs teacher         Address  Phone  Notes  ASAP Residential Treatment [place],    [place]  [phone]   [facility]  [address], [place] [address], [place], [place]   [facility], [place] [phone] Admissions: 8am-3pm M-F  Incentives Substance [place] [address],    [place], [place] [phone]   [facility] [address] [place], [place], [place]   [facility] [address],  [place], [place]   [facility] - Intensive Outpatient [address] Kristeen Mans 9, [place], [employer]   [facility] ([place]  [place], [place] [phone] or [phone]   [facility]idential Treatment Services (RTS) [address], [place], [place] Accepts Medicaid  [facility] [place] [address]  [place] [phone] Substance Abuse/Addiction Treatment   [facility] Organization         Address  Phone  Notes  [facility]  [phone]   Domenic Schwab, PhD [address] [place], [place]   [phone] or [phone]   [place] [facility] [place], [place] [phone]   [facility] [address] [place], [place] [phone] Insurance/Medicaid/sponsorship through [facility] and Families [address], [address] [place]                                     [place], [place] [phone] [place] [address] [place], [place] [phone]    Dr. Adele Schilder  [phone]   [facility] [place] Dept. 1) [address] [place] 2) [place] 3)  [place] [address], [place] [phone]  [phone]   [place] [phone] or [phone] (After Hours)      Back Pain, Adult Low back pain is very common. About 1 in 5 people have back pain.The cause of low back pain is rarely dangerous. The pain often gets better over time.About half of people with a sudden onset of back pain feel better in just 2 weeks. About 8 in 10 people feel better by 6 weeks.  CAUSES Some common causes of back pain include:  Strain of the muscles or ligaments supporting the spine.  Wear and tear (degeneration) of the spinal discs.  Arthritis.  Direct injury to the back. DIAGNOSIS Most of the time, the direct cause of low back pain is not known.However, back pain can be treated effectively even when the exact cause of the pain is unknown.Answering your caregiver's questions about your overall health  and symptoms is one of the most accurate ways to make sure the cause of your pain is not dangerous. If your caregiver needs more information, he or she may order lab work or imaging tests (X-rays or MRIs).However, even if imaging tests show changes in your back, this usually does not require surgery. HOME CARE INSTRUCTIONS For many people, back pain returns.Since low back pain is rarely dangerous, it is often a condition that people can learn to [facility] their own.   Remain active. It is stressful on the back to sit or stand in one place. Do not sit, drive, or stand in one place for more than 30 minutes at a time. Take short walks on level surfaces as soon as pain allows.Try to increase the length of time you walk each  day.  Do not stay in bed.Resting more than 1 or 2 days can delay your recovery.  Do not avoid exercise or work.Your body is made to move.It is not dangerous to be active, even though your back may hurt.Your back will likely heal faster if you return to being active before your pain is gone.  Pay attention to your body when you bend and lift. Many people have less discomfortwhen lifting if they bend their knees, keep the load close to their bodies,and avoid twisting. Often, the most comfortable positions are those that put less stress on your recovering back.  Find a comfortable position to sleep. Use a firm mattress and lie on your side with your knees slightly bent. If you lie on your back, put a pillow under your knees.  Only take over-the-counter or prescription medicines as directed by your caregiver. Over-the-counter medicines to reduce pain and inflammation are often the most helpful.Your caregiver may prescribe muscle relaxant drugs.These medicines help dull your pain so you can more quickly return to your normal activities and healthy exercise.  Put ice on the injured area.  Put ice in a plastic bag.  Place a towel between your skin and the bag.  Leave the ice on for 15-20 minutes, 03-04 times a day for the first 2 to 3 days. After that, ice and heat may be alternated to reduce pain and spasms.  Ask your caregiver about trying back exercises and gentle massage. This may be of some benefit.  Avoid feeling anxious or stressed.Stress increases muscle tension and can worsen back pain.It is important to recognize when you are anxious or stressed and learn ways to manage it.Exercise is a great option. SEEK MEDICAL CARE IF:  You have pain that is not relieved with rest or medicine.  You have pain that does not improve in 1 week.  You have new symptoms.  You are generally not feeling well. SEEK IMMEDIATE MEDICAL CARE IF:   You have pain that radiates from your back into  your legs.  You develop new bowel or bladder control problems.  You have unusual weakness or numbness in your arms or legs.  You develop nausea or vomiting.  You develop abdominal pain.  You feel faint. Document Released: 10/05/2005 Document Revised: 04/05/2012 Document Reviewed: 02/06/2014 [facility] Patient Information 2015 [place], [place]. This information is not intended to replace advice given to you by your health care provider. Make sure you discuss any questions you have with your health care provider.

## 2014-08-07 NOTE — ED Notes (Signed)
Pt c/o generalized back pain x 2-3 weeks.  Pain score 5/10.  Pt reports he was moving furniture and thinks he pulled something.  Sts intermittent numbness in L lower back since injury.  Sts pain increases w/ movement.

## 2014-08-27 ENCOUNTER — Telehealth: Payer: Self-pay | Admitting: Oncology

## 2014-08-27 NOTE — Telephone Encounter (Signed)
Pt called to confirm labs/ov, pt req sch mailed, mailed pt shc...Marland KitchenMarland KitchenMarland Kitchen KJ

## 2014-10-15 ENCOUNTER — Emergency Department (HOSPITAL_COMMUNITY): Payer: Medicare Other

## 2014-10-15 ENCOUNTER — Encounter (HOSPITAL_COMMUNITY): Payer: Self-pay | Admitting: Emergency Medicine

## 2014-10-15 ENCOUNTER — Emergency Department (HOSPITAL_COMMUNITY)
Admission: EM | Admit: 2014-10-15 | Discharge: 2014-10-15 | Disposition: A | Discharge status: Home / Self Care | Payer: Medicare Other | Attending: Emergency Medicine | Admitting: Emergency Medicine

## 2014-10-15 DIAGNOSIS — Z791 Long term (current) use of non-steroidal anti-inflammatories (NSAID): Secondary | ICD-10-CM | POA: Insufficient documentation

## 2014-10-15 DIAGNOSIS — J069 Acute upper respiratory infection, unspecified: Secondary | ICD-10-CM | POA: Insufficient documentation

## 2014-10-15 DIAGNOSIS — Z880 Allergy status to penicillin: Secondary | ICD-10-CM | POA: Diagnosis not present

## 2014-10-15 DIAGNOSIS — Z590 Homelessness: Secondary | ICD-10-CM | POA: Diagnosis not present

## 2014-10-15 DIAGNOSIS — I10 Essential (primary) hypertension: Secondary | ICD-10-CM | POA: Insufficient documentation

## 2014-10-15 DIAGNOSIS — J45909 Unspecified asthma, uncomplicated: Secondary | ICD-10-CM | POA: Insufficient documentation

## 2014-10-15 DIAGNOSIS — R112 Nausea with vomiting, unspecified: Secondary | ICD-10-CM | POA: Insufficient documentation

## 2014-10-15 DIAGNOSIS — H548 Legal blindness, as defined in USA: Secondary | ICD-10-CM | POA: Diagnosis not present

## 2014-10-15 DIAGNOSIS — Z79899 Other long term (current) drug therapy: Secondary | ICD-10-CM | POA: Insufficient documentation

## 2014-10-15 DIAGNOSIS — Z85038 Personal history of other malignant neoplasm of large intestine: Secondary | ICD-10-CM | POA: Insufficient documentation

## 2014-10-15 DIAGNOSIS — R05 Cough: Secondary | ICD-10-CM | POA: Diagnosis present

## 2014-10-15 MED ORDER — PROMETHAZINE HCL 25 MG PO TABS: 25.0000 mg | ORAL_TABLET | Freq: Four times a day (QID) | ORAL | Status: DC | PRN

## 2014-10-15 MED ORDER — HYDROCODONE-ACETAMINOPHEN 5-325 MG PO TABS
1.0000 | ORAL_TABLET | Freq: Once | ORAL | Status: AC
Start: 2014-10-15 — End: 2014-10-15
  Administered 2014-10-15: 1 via ORAL
  Filled 2014-10-15: qty 1

## 2014-10-15 MED ORDER — HYDROCODONE-ACETAMINOPHEN 5-325 MG PO TABS: ORAL_TABLET | ORAL | Status: DC

## 2014-10-15 MED ORDER — PROMETHAZINE HCL 25 MG PO TABS
25.0000 mg | ORAL_TABLET | ORAL | Status: AC
  Administered 2014-10-15: 25 mg via ORAL
  Filled 2014-10-15: qty 1

## 2014-10-15 NOTE — Discharge Instructions (Signed)
Return to the emergency room for any worsening or concerning symptoms including fast breathing, heart racing, confusion, vomiting.  Rest, cover your mouth when you cough and wash your hands frequently.   Push fluids: take small frequent sips of water or Gatorade, do not drink any soda, juice or caffeinated beverages.    Slowly resume solid diet as desired. Avoid food that are spicy, contain dairy and/or have high fat content.  Aviod NSAIDs (aspirin, motrin, ibuprofen, naproxen, Aleve et Ronney Asters) for pain control because they will irritate your stomach.  Do not return to work until a day after your fever breaks.   Take Vicodin for cough and pain control, do not drink alcohol, drive, care for children or do other critical tasks while taking Vicodin     Please follow with your primary care doctor in the next 2 days for a check-up. They must obtain records for further management.   Do not hesitate to return to the Emergency Department for any new, worsening or concerning symptoms.

## 2014-10-15 NOTE — ED Notes (Signed)
Per pt, states cold symptoms for a week-nonproductive cough

## 2014-10-15 NOTE — ED Provider Notes (Signed)
CSN: [id number]     Arrival date & time 10/15/14  1025 History   First MD Initiated Contact with Patient 10/15/14 1006     Chief Complaint  Patient presents with  . Cough     (Consider location/radiation/quality/duration/timing/severity/associated sxs/prior Treatment) HPI   Ralph Ryan is a 58 y.o. male complaining of runny nose, nasal congestion, dry cough and 2 episodes of nonbloody, nonbilious, coffee-ground emesis (emesis as the last 24 hours) upper respiratory symptoms started approximately 6 days ago. Patient denies fever, chills, chest pain, shortness of breath, wheezing, abdominal pain, decreased by mouth intake, decreased urine output, diarrhea, diagnosis of COPD. Patient is homeless, lives in a shelter and states that he is exposed to tobacco smoke but that he is a nonsmoker himself. States that he's having difficulty sleeping secondary to cough.  Past Medical History  Diagnosis Date  . Allergy   . Hypertension   . Asthma   . Cancer     colon  . Legal blindness   . Abdominal pain    Past Surgical History  Procedure Laterality Date  . Knee surgery    . Hand surgery    . Foot surgery    . Colon surgery    . Colectomy  05/06/11    lap assist sigmoid colectomy   Family History  Problem Relation Age of Onset  . Cancer Mother    History  Substance Use Topics  . Smoking status: Never Smoker   . Smokeless tobacco: Never Used  . Alcohol Use: No    Review of Systems  10 systems reviewed and found to be negative, except as noted in the HPI.   Allergies  Codeine; Penicillins; and Aspirin  Home Medications   Prior to Admission medications   Medication Sig Start Date End Date Taking? Authorizing Provider  cetirizine (ZYRTEC) 10 MG tablet Take 10 mg by mouth daily.    Charolotte Eke Powers  cyclobenzaprine (FLEXERIL) 10 MG tablet Take 1 tablet (10 mg total) by mouth 2 (two) times daily as needed for muscle spasms. 08/07/14   Charlesetta Shanks, MD   HYDROcodone-acetaminophen (NORCO/VICODIN) 5-325 MG per tablet Take 1-2 tablets by mouth every 6 hours as needed for pain and/or cough. 10/15/14   Amna Welker, PA-C  lisinopril (PRINIVIL,ZESTRIL) 10 MG tablet Take 10 mg by mouth daily. 05/18/13   Charolotte Eke Powers  naproxen (NAPROSYN) 500 MG tablet Take 1 tablet (500 mg total) by mouth 2 (two) times daily. 08/07/14   Charlesetta Shanks, MD  omeprazole (PRILOSEC) 20 MG capsule Take 20 mg by mouth daily.    Stephanie J Powers  PATADAY 0.2 % SOLN Apply 1 drop to eye as directed. Both eyes 05/18/13   Historical Provider, MD  promethazine (PHENERGAN) 25 MG tablet Take 1 tablet (25 mg total) by mouth every 6 (six) hours as needed for nausea or vomiting. 10/15/14   Elmyra Ricks Nashid Pellum, PA-C   BP 112/71 mmHg  Pulse 73  Temp(Src) 98.1 F (36.7 C) (Oral)  Resp 18  SpO2 100% Physical Exam  Constitutional: He is oriented to person, place, and time. He appears well-developed and well-nourished. No distress.  HENT:  Head: Normocephalic and atraumatic.  Mouth/Throat: Oropharynx is clear and moist.  No drooling or stridor. Posterior pharynx mildly erythematous no significant tonsillar hypertrophy. No exudate. Soft palate rises symmetrically. No TTP or induration under tongue.   No tenderness to palpation of frontal or bilateral maxillary sinuses.   No mucosal edema in the nares.  Bilateral tympanic membranes  with normal architecture and good light reflex.   Eyes: Conjunctivae and EOM are normal. Pupils are equal, round, and reactive to light.  Neck: Normal range of motion.  Cardiovascular: Normal rate, regular rhythm and intact distal pulses.   Pulmonary/Chest: Effort normal and breath sounds normal. No stridor. No respiratory distress. He has no wheezes. He has no rales. He exhibits no tenderness.  Abdominal: Soft. Bowel sounds are normal. He exhibits no distension and no mass. There is no tenderness. There is no rebound and no guarding.   Musculoskeletal: Normal range of motion.  Neurological: He is alert and oriented to person, place, and time.  Psychiatric: He has a normal mood and affect.  Nursing note and vitals reviewed.   ED Course  Procedures (including critical care time) Labs Review Labs Reviewed - No data to display  Imaging Review Dg Chest 2 View  10/15/2014   CLINICAL DATA:  Nonproductive cough and cold symptoms for 1 week.  EXAM: CHEST  2 VIEW  COMPARISON:  10/27/2012.  FINDINGS: Trachea is midline. Heart is mildly enlarged, stable. Lungs are somewhat low in volume with linear scarring in the left lower lobe, as before. No airspace consolidation or pleural fluid.  IMPRESSION: No acute findings.   Electronically Signed   By: Lorin Picket M.D.   On: 10/15/2014 09:38     EKG Interpretation None      MDM   Final diagnoses:  URI (upper respiratory infection)  Non-intractable vomiting with nausea, vomiting of unspecified type    Filed Vitals:   10/15/14 0911  BP: 112/71  Pulse: 73  Temp: 98.1 F (36.7 C)  TempSrc: Oral  Resp: 18  SpO2: 100%    Medications  HYDROcodone-acetaminophen (NORCO/VICODIN) 5-325 MG per tablet 1 tablet (1 tablet Oral Given 10/15/14 1045)  promethazine (PHENERGAN) tablet 25 mg (25 mg Oral Given 10/15/14 1045)    Ralph Ryan is a pleasant 58 y.o. male presenting with upper respiratory symptoms worsening over the course of the week patient had 2 episodes of vomiting in the last 24 hours. Patient is afebrile and well-appearing, lung sounds are clear to auscultation, patient saturating well on room air, chest x-ray with no infiltrate. Will by mouth challenge with Vicodin for cough suppression and Phenergan for relief of nausea. Patient will be sent home with symptomatic treatment of cough he has an appointment with his primary care physician on the 30th.  Patient tolerated liquids and medications without issue, no emesis.  Evaluation does not show pathology that  would require ongoing emergent intervention or inpatient treatment. Pt is hemodynamically stable and mentating appropriately. Discussed findings and plan with patient/guardian, who agrees with care plan. All questions answered. Return precautions discussed and outpatient follow up given.   New Prescriptions   HYDROCODONE-ACETAMINOPHEN (NORCO/VICODIN) 5-325 MG PER TABLET    Take 1-2 tablets by mouth every 6 hours as needed for pain and/or cough.   PROMETHAZINE (PHENERGAN) 25 MG TABLET    Take 1 tablet (25 mg total) by mouth every 6 (six) hours as needed for nausea or vomiting.         Monico Blitz, PA-C 10/15/14 [place], MD 10/16/14 [phone]

## 2014-12-12 ENCOUNTER — Telehealth: Payer: Self-pay | Admitting: Nurse Practitioner

## 2014-12-12 ENCOUNTER — Other Ambulatory Visit: Payer: Medicare Other

## 2014-12-12 ENCOUNTER — Ambulatory Visit (HOSPITAL_BASED_OUTPATIENT_CLINIC_OR_DEPARTMENT_OTHER): Payer: Medicaid Other | Admitting: Nurse Practitioner

## 2014-12-12 VITALS — BP 131/75 | HR 71 | Temp 98.4°F | Resp 20 | Ht 68.0 in | Wt 299.4 lb

## 2014-12-12 DIAGNOSIS — C189 Malignant neoplasm of colon, unspecified: Secondary | ICD-10-CM

## 2014-12-12 DIAGNOSIS — I10 Essential (primary) hypertension: Secondary | ICD-10-CM

## 2014-12-12 DIAGNOSIS — Z85038 Personal history of other malignant neoplasm of large intestine: Secondary | ICD-10-CM | POA: Diagnosis present

## 2014-12-12 DIAGNOSIS — H548 Legal blindness, as defined in USA: Secondary | ICD-10-CM

## 2014-12-12 NOTE — Progress Notes (Signed)
**Ralph Ralph**   Ralph Ralph   Diagnosis:  Colon cancer  INTERVAL HISTORY:   Ralph Ralph returns as scheduled. He feels well. No nausea or vomiting. No abdominal pain. No change in bowel habits. Appetite is "so-so".  Objective:  Vital signs in last 24 hours:  Blood pressure 131/75, pulse 71, temperature 98.4 F (36.9 C), temperature source Oral, resp. rate 20, height 5\' 8"  (1.727 m), weight 299 lb 6.4 oz (135.807 kg), SpO2 100 %.    HEENT: No thrush or ulcers. Lymphatics: No palpable cervical, supra clavicular, axillary or inguinal lymph nodes. Resp: Lungs clear bilaterally. Cardio: Regular rate and rhythm. GI: Abdomen soft and nontender. No hepatomegaly. Vascular: No leg edema.   Lab Results:  Lab Results  Component Value Date   WBC 5.4 10/24/2012   HGB 13.6 10/24/2012   HCT 40.0 10/24/2012   MCV 87.6 10/24/2012   PLT 218 10/24/2012   NEUTROABS 3.3 10/24/2012    Imaging:  No results found.  Medications: I have reviewed the patient's current medications.  Assessment/Plan: 1. 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 He began adjuvant Xeloda chemotherapy 06/10/2011. He completed the eighth and final cycle of adjuvant Xeloda chemotherapy beginning on 11/12/2011. -Surveillance colonoscopy August 2013 with removal of tubular adenomas and a hyperplastic polyp.  2. Hand-foot syndrome secondary to Xeloda, improved with a dose reduction beginning with cycle #3. 3. Hypertension. 4. Asthma. 5. Legal blindness.   Disposition: Ralph Ralph appears stable. He remains in clinical remission from colon cancer. We will follow-up on the CEA from today. He will return for follow-up visit and CEA in 6 months.  He will be due for a colonoscopy in August 2016. He requested we go ahead and make the referral to Dr. Benson Norway. A referral was entered into the system today.    Ned Card  ANP/GNP-BC   12/12/2014  12:01 PM

## 2014-12-12 NOTE — Telephone Encounter (Signed)
Gave avs & calendar for August. See referral notes.

## 2014-12-13 LAB — CEA: CEA: 1.7 ng/mL (ref 0.0–5.0)

## 2014-12-14 ENCOUNTER — Telehealth: Payer: Self-pay

## 2014-12-14 ENCOUNTER — Telehealth: Payer: Self-pay | Admitting: Oncology

## 2014-12-14 NOTE — Telephone Encounter (Signed)
Faxed office notes to Dr. Benson Norway

## 2014-12-14 NOTE — Telephone Encounter (Signed)
Called and informed pt of normal CEA results.

## 2015-03-27 ENCOUNTER — Telehealth: Payer: Self-pay | Admitting: Oncology

## 2015-03-27 NOTE — Telephone Encounter (Signed)
Pt called to get updated schedule confirmed labs/ov D/T and mailed out schedule, confirmed address was correct... KJ

## 2015-06-10 ENCOUNTER — Telehealth: Payer: Self-pay | Admitting: Oncology

## 2015-06-10 NOTE — Telephone Encounter (Signed)
Lft msg for pt confirming labs/ov per MD staff msg. Mailed out schedule to pt... KJ

## 2015-06-13 ENCOUNTER — Ambulatory Visit: Payer: Medicare Other | Admitting: Oncology

## 2015-06-13 ENCOUNTER — Other Ambulatory Visit: Payer: Medicare Other

## 2015-06-27 ENCOUNTER — Ambulatory Visit (HOSPITAL_BASED_OUTPATIENT_CLINIC_OR_DEPARTMENT_OTHER): Payer: 59 | Admitting: Nurse Practitioner

## 2015-06-27 ENCOUNTER — Other Ambulatory Visit (HOSPITAL_BASED_OUTPATIENT_CLINIC_OR_DEPARTMENT_OTHER): Payer: 59

## 2015-06-27 ENCOUNTER — Telehealth: Payer: Self-pay | Admitting: Nurse Practitioner

## 2015-06-27 VITALS — BP 134/70 | HR 65 | Temp 98.5°F | Resp 18 | Ht 68.0 in | Wt 284.9 lb

## 2015-06-27 DIAGNOSIS — C189 Malignant neoplasm of colon, unspecified: Secondary | ICD-10-CM

## 2015-06-27 DIAGNOSIS — I10 Essential (primary) hypertension: Secondary | ICD-10-CM | POA: Diagnosis not present

## 2015-06-27 DIAGNOSIS — Z85038 Personal history of other malignant neoplasm of large intestine: Secondary | ICD-10-CM

## 2015-06-27 DIAGNOSIS — R634 Abnormal weight loss: Secondary | ICD-10-CM

## 2015-06-27 DIAGNOSIS — H548 Legal blindness, as defined in USA: Secondary | ICD-10-CM

## 2015-06-27 NOTE — Progress Notes (Signed)
  Ralph Ryan OFFICE PROGRESS NOTE   Diagnosis: Colon cancer   INTERVAL HISTORY:   Ralph Ryan returns as scheduled. Bowels moving regularly. He reports a recent colonoscopy was unremarkable. He is not aware of any bleeding. No nausea or vomiting. He has occasional abdominal pain. No consistent pain. He reports weight loss recently. Appetite has been diminished over the past week.  He would like to see a dermatologist regarding some pruritic skin lesions on his forehead.  Objective:  Vital signs in last 24 hours:  Blood pressure 134/70, pulse 65, temperature 98.5 F (36.9 C), temperature source Oral, resp. rate 18, height 5\' 8"  (1.727 m), weight 284 lb 14.4 oz (129.23 kg), SpO2 100 %.    HEENT: No thrush or ulcers. Lymphatics: No palpable cervical, supra clavicular, axillary or inguinal lymph nodes. Resp: Lungs clear bilaterally. Cardio: Regular rate and rhythm. GI: Abdomen is soft. Mild tenderness right upper abdomen. No hepatomegaly. No mass. Vascular: No leg edema. Skin: 4 hyperpigmented raised skin lesions on the forehead.   Lab Results:  Lab Results  Component Value Date   WBC 5.4 10/24/2012   HGB 13.6 10/24/2012   HCT 40.0 10/24/2012   MCV 87.6 10/24/2012   PLT 218 10/24/2012   NEUTROABS 3.3 10/24/2012    Imaging:  No results found.  Medications: I have reviewed the patient's current medications.  Assessment/Plan: 1. 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 He began adjuvant Xeloda chemotherapy 06/10/2011. He completed the eighth and final cycle of adjuvant Xeloda chemotherapy beginning on 11/12/2011. -Surveillance colonoscopy August 2013 with removal of tubular adenomas and a hyperplastic polyp.  2. History of hand-foot syndrome secondary to Xeloda, improved with a dose reduction beginning with cycle #3. 3. Hypertension. 4. Asthma. 5. Legal  blindness.   Disposition: [name] appears stable. We will follow-up on the CEA from today.   The etiology of the weight loss is unclear. He will follow-up with Dr. Jeanie Cooks for further evaluation. We will see him back in one month. If he has had further weight loss and there is no clear explanation we will consider obtaining CT scans.  I made a referral to dermatology to evaluate the skin lesions on his forehead.  We will see him back in one month. He will contact the office in the interim with any problems.   Ned Card ANP/GNP-BC   06/27/2015  2:18 PM

## 2015-06-27 NOTE — Telephone Encounter (Signed)
per pof to sch pt appt-gave pt copy of avs °

## 2015-06-28 LAB — CEA: CEA: 1.5 ng/mL (ref 0.0–5.0)

## 2015-07-02 ENCOUNTER — Telehealth: Payer: Self-pay | Admitting: Oncology

## 2015-07-02 NOTE — Telephone Encounter (Signed)
Faxed pt office notes to [facility]

## 2015-07-03 ENCOUNTER — Encounter: Payer: Self-pay | Admitting: Skilled Nursing Facility1

## 2015-07-03 NOTE — Progress Notes (Signed)
Subjective:     Patient ID: Ralph Ryan, male   DOB: 09-15-1956, 59 y.o.   MRN: [id number]  HPI   Review of Systems     Objective:   Physical Exam To assist the pt in identifying dietary strategies to gain some lost wt back.     Assessment:     Pt identified as being malnourished due to losing some wt. Pt contacted via the telephone at [phone] but was unavailable.    Plan:     Dietitian left a message prompting the pt to contact [place] at [phone].

## 2015-07-22 ENCOUNTER — Encounter: Payer: 59 | Admitting: Nutrition

## 2015-07-23 ENCOUNTER — Ambulatory Visit: Payer: Medicaid Other | Admitting: Nutrition

## 2015-07-23 NOTE — Progress Notes (Signed)
Patient requested nutrition consult.  Patient was identified to be at risk for malnutrition on the MST secondary to weight loss and poor appetite.  Patient is a 59 year old male diagnosed with colon cancer status post treatment.  Past medical history includes hypertension, asthma, and legal blindness.  Medications include Prilosec and Phenergan.  Labs were reviewed.  Height: 68 inches. Weight: 283.6 pounds October 4. Usual body weight: 299 pounds in February 2016.  Patient reports 344 pounds was his usual body weight prior to cancer. BMI: 43.12.  Patient reports he has tried to improve his diet and is no longer eating "junk." Patient typically eats 2 meals daily and tries to eat lots of fruits and vegetables. Patient states he never consumes redmeat but does eat processed meats.  Nutrition diagnosis:  Food and nutrition related knowledge deficit related to colon cancer as evidenced by no prior need for nutrition related information.  Intervention: Educated patient on a healthy plant-based diet to prevent recurrence of colon cancer. Educated patient on reducing intake of processed meats and to continue decreased intake of red meat. Provided fact sheets on plant-based diet and diet and prevent colorectal cancer. Reviewed sample menus with patient. Questions were answered and teach back method used. Patient was provided with fact sheets and my contact information.  Monitoring, evaluation, goals: Patient will tolerate a healthy plant-based diet to prevent recurrence of colon cancer.  Diagnosis of food and nutrition related knowledge deficit resolved.

## 2015-07-25 ENCOUNTER — Ambulatory Visit (HOSPITAL_BASED_OUTPATIENT_CLINIC_OR_DEPARTMENT_OTHER): Payer: Medicaid Other | Admitting: Nurse Practitioner

## 2015-07-25 ENCOUNTER — Telehealth: Payer: Self-pay | Admitting: Oncology

## 2015-07-25 VITALS — BP 112/70 | HR 56 | Temp 97.9°F | Resp 17 | Ht 68.0 in | Wt 283.0 lb

## 2015-07-25 DIAGNOSIS — I10 Essential (primary) hypertension: Secondary | ICD-10-CM | POA: Diagnosis not present

## 2015-07-25 DIAGNOSIS — C187 Malignant neoplasm of sigmoid colon: Secondary | ICD-10-CM | POA: Diagnosis not present

## 2015-07-25 DIAGNOSIS — C189 Malignant neoplasm of colon, unspecified: Secondary | ICD-10-CM

## 2015-07-25 NOTE — Progress Notes (Addendum)
  Center OFFICE PROGRESS NOTE   Diagnosis: Colon cancer   INTERVAL HISTORY:   Ralph Ryan returns for follow-up of weight loss. At the time of his last visit on 06/27/2015 he was noted to have lost about 15 pounds over the preceding 6 months and approximately 30 pounds over the past 2 years.  He thinks his appetite is some better. He reports he has been exercising. He denies nausea/vomiting. No diarrhea. No abdominal pain. No shortness of breath.  He struck the right lower leg on a couch 2 or 3 days ago. Since then he has noted pain and swelling at the lower leg with ambulation.  He reports having a skin lesion on his forehead biopsied earlier today.  Objective:  Vital signs in last 24 hours:  Blood pressure 112/70, pulse 56, temperature 97.9 F (36.6 C), temperature source Oral, resp. rate 17, height 5\' 8"  (1.727 m), weight 283 lb (128.368 kg), SpO2 100 %.    HEENT: No thrush or ulcers. Resp: Lungs clear bilaterally. Cardio: Regular rate and rhythm. GI: Abdomen soft and nontender. No hepatomegaly. Vascular: Ecchymosis right lower anterior leg, associated tenderness and edema. Skin: Multiple hyperpigmented raised skin lesions on the forehead. One lesion is covered with a Band-Aid.   Lab Results:  Lab Results  Component Value Date   WBC 5.4 10/24/2012   HGB 13.6 10/24/2012   HCT 40.0 10/24/2012   MCV 87.6 10/24/2012   PLT 218 10/24/2012   NEUTROABS 3.3 10/24/2012   06/27/2015 CEA 1.5  Imaging:  No results found.  Medications: I have reviewed the patient's current medications.  Assessment/Plan: 1. 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 He began adjuvant Xeloda chemotherapy 06/10/2011. He completed the eighth and final cycle of adjuvant Xeloda chemotherapy beginning on 11/12/2011. -Surveillance colonoscopy August 2013 with removal of tubular adenomas and a hyperplastic  polyp.  2. History of hand-foot syndrome secondary to Xeloda, improved with a dose reduction beginning with cycle #3. 3. Hypertension. 4. Asthma. 5. Legal blindness.   Disposition: Ralph Ryan appears stable. His weight has not changed significantly as compared to 1 month ago. We have a low clinical suspicion that the weight loss is related to recurrent colon cancer. He plans to schedule an appointment with his PCP for further evaluation of weight loss.  He injured the right lower leg earlier this week. If symptoms do not improve over the next several days he will follow-up with his PCP.  He will return for a follow-up visit here with a CEA in 5 months. He will contact the office in the interim with any problems.  Patient seen with Dr. Benay Spice.    Ned Card ANP/GNP-BC   07/25/2015  2:25 PM  This was a shared visit with Ned Card. Ralph Ryan was interviewed and examined.  He remains in clinical remission from colon cancer. We recommended he follow-up with his primary physician for continued weight loss.  Julieanne Manson, M.D.

## 2015-07-25 NOTE — Telephone Encounter (Signed)
Gave patient avs report and appointments for March 2017

## 2015-12-05 DIAGNOSIS — R0989 Other specified symptoms and signs involving the circulatory and respiratory systems: Secondary | ICD-10-CM

## 2015-12-07 ENCOUNTER — Encounter (HOSPITAL_COMMUNITY): Payer: Self-pay | Admitting: Emergency Medicine

## 2015-12-07 ENCOUNTER — Emergency Department (HOSPITAL_COMMUNITY): Payer: Medicare HMO

## 2015-12-07 ENCOUNTER — Emergency Department (HOSPITAL_COMMUNITY)
Admission: EM | Admit: 2015-12-07 | Discharge: 2015-12-07 | Disposition: A | Discharge status: Home / Self Care | Payer: Medicare HMO | Attending: Emergency Medicine | Admitting: Emergency Medicine

## 2015-12-07 DIAGNOSIS — J069 Acute upper respiratory infection, unspecified: Secondary | ICD-10-CM | POA: Insufficient documentation

## 2015-12-07 DIAGNOSIS — J45909 Unspecified asthma, uncomplicated: Secondary | ICD-10-CM | POA: Insufficient documentation

## 2015-12-07 DIAGNOSIS — D649 Anemia, unspecified: Secondary | ICD-10-CM

## 2015-12-07 DIAGNOSIS — I10 Essential (primary) hypertension: Secondary | ICD-10-CM | POA: Diagnosis not present

## 2015-12-07 DIAGNOSIS — Z880 Allergy status to penicillin: Secondary | ICD-10-CM | POA: Diagnosis not present

## 2015-12-07 DIAGNOSIS — Z79899 Other long term (current) drug therapy: Secondary | ICD-10-CM | POA: Insufficient documentation

## 2015-12-07 DIAGNOSIS — H548 Legal blindness, as defined in USA: Secondary | ICD-10-CM | POA: Insufficient documentation

## 2015-12-07 DIAGNOSIS — R079 Chest pain, unspecified: Secondary | ICD-10-CM | POA: Diagnosis present

## 2015-12-07 DIAGNOSIS — Z85038 Personal history of other malignant neoplasm of large intestine: Secondary | ICD-10-CM | POA: Diagnosis not present

## 2015-12-07 HISTORY — DX: Unspecified asthma, uncomplicated: J45.909

## 2015-12-07 LAB — CBC
HCT: 35.7 % — ABNORMAL LOW (ref 39.0–52.0)
Hemoglobin: 11.6 g/dL — ABNORMAL LOW (ref 13.0–17.0)
MCH: 29.2 pg (ref 26.0–34.0)
MCHC: 32.5 g/dL (ref 30.0–36.0)
MCV: 89.9 fL (ref 78.0–100.0)
PLATELETS: 209 10*3/uL (ref 150–400)
RBC: 3.97 MIL/uL — ABNORMAL LOW (ref 4.22–5.81)
RDW: 13.5 % (ref 11.5–15.5)
WBC: 4.9 10*3/uL (ref 4.0–10.5)

## 2015-12-07 LAB — BASIC METABOLIC PANEL
Anion gap: 10 (ref 5–15)
BUN: 9 mg/dL (ref 6–20)
CALCIUM: 8.8 mg/dL — AB (ref 8.9–10.3)
CO2: 26 mmol/L (ref 22–32)
CREATININE: 1.07 mg/dL (ref 0.61–1.24)
Chloride: 99 mmol/L — ABNORMAL LOW (ref 101–111)
GFR calc non Af Amer: >60 mL/min (ref >60)
Glucose, Bld: 105 mg/dL — ABNORMAL HIGH (ref 65–99)
Potassium: 3.9 mmol/L (ref 3.5–5.1)
Sodium: 135 mmol/L (ref 135–145)

## 2015-12-07 LAB — I-STAT TROPONIN, ED: TROPONIN I, POC: 0 ng/mL (ref 0.00–0.08)

## 2015-12-07 MED ORDER — BENZONATATE 100 MG PO CAPS: 100.0000 mg | ORAL_CAPSULE | Freq: Three times a day (TID) | ORAL | Status: DC

## 2015-12-07 MED ORDER — HYDROCODONE-ACETAMINOPHEN 5-325 MG PO TABS
1.0000 | ORAL_TABLET | Freq: Once | ORAL | Status: AC
  Administered 2015-12-07: 1 via ORAL
  Filled 2015-12-07: qty 1

## 2015-12-07 NOTE — ED Notes (Signed)
Reports cough for three days and he cannot sleep. Reports chest pain with the coughing. Denies any fever or chills at home. Reports he vomited this morning due to coughing so intense, he does not feel nauseated.

## 2015-12-07 NOTE — ED Provider Notes (Signed)
CSN: [id number]     Arrival date & time 12/07/15  0919 History   First MD Initiated Contact with Patient 12/07/15 [phone]     Chief Complaint  Patient presents with  . Cough  . Chest Pain     (Consider location/radiation/quality/duration/timing/severity/associated sxs/prior Treatment) Patient is a 60 y.o. male presenting with cough and chest pain. The history is provided by the patient and medical records. No language interpreter was used.  Cough Associated symptoms: chest pain (with coughing) and rhinorrhea   Associated symptoms: no chills, no fever, no headaches, no rash, no shortness of breath and no wheezing   Chest Pain Associated symptoms: cough   Associated symptoms: no abdominal pain, no back pain, no dizziness, no dysphagia, no fever, no headache, no nausea, no palpitations, no shortness of breath, not vomiting and no weakness    Ralph Ryan is a 60 y.o. male  with a PMH of HTN who presents to the Emergency Department complaining of three day history of cough, occasionally productive, leading to one episode of post-tussive emesis today. Associated symptoms include chest tightness, chest pain only with coughing fits. Denies fever, shortness of breath, and exertional chest pain. No medications taken prior to arrival. Patient states cough is worse when exposed to cigarette smoke which has occurred multiple times over the last 3 days. No history of COPD, not a smoker.   Past Medical History  Diagnosis Date  . Allergy   . Hypertension   . Cancer ([place])     colon  . Legal blindness   . Abdominal pain   . Asthma     Patient states he does not have asthma   Past Surgical History  Procedure Laterality Date  . Knee surgery    . Hand surgery    . Foot surgery    . Colon surgery    . Colectomy  05/06/11    lap assist sigmoid colectomy   Family History  Problem Relation Age of Onset  . Cancer Mother    Social History  Substance Use Topics  . Smoking status: Never Smoker    . Smokeless tobacco: Never Used  . Alcohol Use: No    Review of Systems  Constitutional: Negative for fever and chills.  HENT: Positive for rhinorrhea. Negative for trouble swallowing.   Eyes: Negative for visual disturbance.  Respiratory: Positive for cough and chest tightness. Negative for shortness of breath and wheezing.   Cardiovascular: Positive for chest pain (with coughing). Negative for palpitations and leg swelling.  Gastrointestinal: Negative for nausea, vomiting and abdominal pain.  Genitourinary: Negative for dysuria.  Musculoskeletal: Negative for back pain and neck pain.  Skin: Negative for rash.  Neurological: Negative for dizziness, weakness and headaches.      Allergies  Codeine; Penicillins; and Aspirin  Home Medications   Prior to Admission medications   Medication Sig Start Date End Date Taking? Authorizing Provider  amLODipine (NORVASC) 10 MG tablet Take 10 mg by mouth daily.    Historical Provider, MD  benzonatate (TESSALON) 100 MG capsule Take 1 capsule (100 mg total) by mouth every 8 (eight) hours. 12/07/15   Ozella Almond Keshawn Fiorito, PA-C  cetirizine (ZYRTEC) 10 MG tablet Take 10 mg by mouth daily.    Saralyn Pilar, MD  cyclobenzaprine (FLEXERIL) 10 MG tablet Take 1 tablet (10 mg total) by mouth 2 (two) times daily as needed for muscle spasms. Patient not taking: Reported on 12/12/2014 08/07/14   Charlesetta Shanks, MD  HYDROcodone-acetaminophen (NORCO/VICODIN) 5-325 MG  per tablet Take 1-2 tablets by mouth every 6 hours as needed for pain and/or cough. Patient not taking: Reported on 12/12/2014 10/15/14   Elmyra Ricks Pisciotta, PA-C  lisinopril (PRINIVIL,ZESTRIL) 10 MG tablet Take 10 mg by mouth daily. 05/18/13   Saralyn Pilar, MD  naproxen (NAPROSYN) 500 MG tablet Take 1 tablet (500 mg total) by mouth 2 (two) times daily. Patient not taking: Reported on 12/12/2014 08/07/14   Charlesetta Shanks, MD  omeprazole (PRILOSEC) 20 MG capsule Take 20 mg by mouth daily.     Saralyn Pilar, MD  PATADAY 0.2 % SOLN Apply 1 drop to eye as directed. Both eyes 05/18/13   Historical Provider, MD  promethazine (PHENERGAN) 25 MG tablet Take 1 tablet (25 mg total) by mouth every 6 (six) hours as needed for nausea or vomiting. Patient not taking: Reported on 12/12/2014 10/15/14   Elmyra Ricks Pisciotta, PA-C   BP 109/68 mmHg  Pulse 64  Temp(Src) 97.9 F (36.6 C) (Oral)  Resp 18  Ht 5' 9.5" (1.765 m)  Wt 127.007 kg  BMI 40.77 kg/m2  SpO2 98% Physical Exam  Constitutional: He is oriented to person, place, and time. He appears well-developed and well-nourished.  Alert and in no acute distress  HENT:  Head: Normocephalic and atraumatic.  OP with erythema. No exudates, no tonsillar hypertrophy.   Neck: Normal range of motion. Neck supple.  Cardiovascular: Normal rate, regular rhythm and normal heart sounds.  Exam reveals no gallop and no friction rub.   No murmur heard. Pulmonary/Chest: Effort normal and breath sounds normal. No respiratory distress. He has no wheezes. He has no rales. He exhibits no tenderness.  Abdominal: Soft. He exhibits no distension. There is no tenderness.  Musculoskeletal: He exhibits no edema.  Lymphadenopathy:    He has no cervical adenopathy.  Neurological: He is alert and oriented to person, place, and time.  Skin: Skin is warm and dry. No rash noted.  Psychiatric: He has a normal mood and affect. His behavior is normal. Judgment and thought content normal.  Nursing note and vitals reviewed.   ED Course  Procedures (including critical care time) Labs Review Labs Reviewed  BASIC METABOLIC PANEL - Abnormal; Notable for the following:    Chloride 99 (*)    Glucose, Bld 105 (*)    Calcium 8.8 (*)    All other components within normal limits  CBC - Abnormal; Notable for the following:    RBC 3.97 (*)    Hemoglobin 11.6 (*)    HCT 35.7 (*)    All other components within normal limits  I-STAT TROPOININ, ED    Imaging Review Dg Chest  2 View  12/07/2015  CLINICAL DATA:  Pt reports productive cough w/ white sputum and mid-sternal chest pain x 2 days after inhaling second hand cigarette smoke; non-smoker; h/o HTN EXAM: CHEST  2 VIEW COMPARISON:  10/15/2014 FINDINGS: Cardiac silhouette is mildly enlarged. No mediastinal or hilar masses or evidence of adenopathy. Clear lungs.  No pleural effusion or pneumothorax. Bony thorax is intact. IMPRESSION: No active cardiopulmonary disease. Electronically Signed   By: Lajean Manes M.D.   On: 12/07/2015 10:13   I have personally reviewed and evaluated these images and lab results as part of my medical decision-making.   EKG Interpretation None      MDM   Final diagnoses:  URI (upper respiratory infection)  Anemia, unspecified anemia type   Akif T Rosenstock presents with cough and chest tightness x 3 days. Chest pain  is non-exertional and only present with coughing or very deep breaths. Chest pain not likely of cardiac etiology.   Labs: CBC, BMP, Troponin reassuring.  Imaging: CXR with no acute cardiopulmonary disease  Therapeutics: Norco for cough suppression while in ED  A&P: URI  - Tessalon for cough; symptomatic care instructions given  - PCP follow up  - Return precautions given  Ozella Almond Saydee Zolman, PA-C 12/07/15 [place], MD 12/09/15 [id number]

## 2015-12-07 NOTE — Discharge Instructions (Signed)
1. Medications: mucinex as needed, tessalon for cough, keep throat moist with fluids or cough drops, continue usual home medications 2. Treatment: rest, drink plenty of fluids, take tylenol or ibuprofen for fever control if needed 3. Follow Up: Please follow up with your primary doctor in 3 days for discussion of your diagnoses and further evaluation after today's visit; Return to the ER for high fevers, difficulty breathing or other concerning symptoms

## 2015-12-10 NOTE — Congregational Nurse Program (Signed)
Congregational Nurse Program Note  Date of Encounter: 12/05/2015  Past Medical History: Past Medical History  Diagnosis Date  . Allergy   . Hypertension   . Cancer ([place])     colon  . Legal blindness   . Abdominal pain   . Asthma     Patient states he does not have asthma    Encounter Details:     CNP Questionnaire - 12/05/15 1427    Patient Demographics   Is this a new or existing patient? New   Patient is considered a/an Not Applicable   Race African-American/Black   Patient Assistance   Location of Patient Assistance Not Applicable   Patient's financial/insurance status Low Income;Medicaid   Patient referred to apply for the following financial assistance Not Applicable   Food insecurities addressed Provided food supplies   Transportation assistance No   Assistance securing medications No   Educational health offerings Acute disease;Navigating the healthcare system   Encounter Details   Primary purpose of visit Acute Illness/Condition Visit;Education/Health Concerns   Was an Emergency Department visit averted? No   Does patient have a medical provider? Yes   Patient referred to Establish PCP   Was a mental health screening completed? (GAINS tool) No   Does patient have dental issues? No   Does patient have vision issues? No   Since previous encounter, have you referred patient for abnormal blood pressure that resulted in a new diagnosis or medication change? No   Since previous encounter, have you referred patient for abnormal blood glucose that resulted in a new diagnosis or medication change? No   For Abstraction Use Only   Does patient have insurance? No     states does not feel well, "cold symptoms".  C/O of chest congestion.  Non-productive cough. Lungs sounds clear.  Afebrile.   Referred to PCP.

## 2015-12-17 ENCOUNTER — Encounter (HOSPITAL_COMMUNITY): Payer: Self-pay | Admitting: Emergency Medicine

## 2015-12-17 ENCOUNTER — Emergency Department (HOSPITAL_COMMUNITY)
Admission: EM | Admit: 2015-12-17 | Discharge: 2015-12-17 | Disposition: A | Discharge status: Home / Self Care | Payer: Medicare HMO | Attending: Emergency Medicine | Admitting: Emergency Medicine

## 2015-12-17 DIAGNOSIS — J45909 Unspecified asthma, uncomplicated: Secondary | ICD-10-CM | POA: Insufficient documentation

## 2015-12-17 DIAGNOSIS — I10 Essential (primary) hypertension: Secondary | ICD-10-CM | POA: Insufficient documentation

## 2015-12-17 DIAGNOSIS — Z79899 Other long term (current) drug therapy: Secondary | ICD-10-CM | POA: Diagnosis not present

## 2015-12-17 DIAGNOSIS — H548 Legal blindness, as defined in USA: Secondary | ICD-10-CM | POA: Diagnosis not present

## 2015-12-17 DIAGNOSIS — R1110 Vomiting, unspecified: Secondary | ICD-10-CM | POA: Insufficient documentation

## 2015-12-17 DIAGNOSIS — R05 Cough: Secondary | ICD-10-CM | POA: Insufficient documentation

## 2015-12-17 DIAGNOSIS — Z880 Allergy status to penicillin: Secondary | ICD-10-CM | POA: Diagnosis not present

## 2015-12-17 DIAGNOSIS — Z85038 Personal history of other malignant neoplasm of large intestine: Secondary | ICD-10-CM | POA: Insufficient documentation

## 2015-12-17 DIAGNOSIS — R059 Cough, unspecified: Secondary | ICD-10-CM

## 2015-12-17 MED ORDER — BENZONATATE 100 MG PO CAPS
100.0000 mg | ORAL_CAPSULE | Freq: Once | ORAL | Status: AC
  Administered 2015-12-17: 100 mg via ORAL
  Filled 2015-12-17: qty 1

## 2015-12-17 MED ORDER — GUAIFENESIN 100 MG/5ML PO LIQD: 100.0000 mg | ORAL | Status: DC | PRN

## 2015-12-17 NOTE — ED Notes (Signed)
PA at beside. Pt denies lightheadedness when standing, denies chest pain

## 2015-12-17 NOTE — ED Notes (Signed)
Dry cough that's keeping him from sleeping, post tussive chest pain. Denies fever, runny nose, sore throat. Lungs clear bilaterally. Pt has been seen at [facility] for this when it started.

## 2015-12-17 NOTE — ED Provider Notes (Signed)
CSN: [id number]     Arrival date & time 12/17/15  [id number] History   First MD Initiated Contact with Patient 12/17/15 1006     Chief Complaint  Patient presents with  . Cough    HPI   60 year old male presents today with complaints of cough. Patient reports he was seen 10 days ago for acute viral infection that been going on for 3 days. At that time he noted rhinorrhea, cough, fever, chest pain and shortness of breath. He reports that he was given antibiotics, cough medication. He reports the upper respiratory symptoms have improved with the exception of the cough. He reports this is dry and nonproductive, worse at night. He reports an episode of posttussis emesis and chest soreness with coughing. Patient denies any recent fevers, shortness of breath, dizziness,  ACS type chest pain, abdominal pain, respiratory distress, or any other acute concerning signs or symptoms at this time.   Past Medical History  Diagnosis Date  . Allergy   . Hypertension   . Cancer ([place])     colon  . Legal blindness   . Abdominal pain   . Asthma     Patient states he does not have asthma   Past Surgical History  Procedure Laterality Date  . Knee surgery    . Hand surgery    . Foot surgery    . Colon surgery    . Colectomy  05/06/11    lap assist sigmoid colectomy   Family History  Problem Relation Age of Onset  . Cancer Mother    Social History  Substance Use Topics  . Smoking status: Never Smoker   . Smokeless tobacco: Never Used  . Alcohol Use: No    Review of Systems  All other systems reviewed and are negative.    Allergies  Codeine; Penicillins; and Aspirin  Home Medications   Prior to Admission medications   Medication Sig Start Date End Date Taking? Authorizing Provider  amLODipine (NORVASC) 10 MG tablet Take 10 mg by mouth daily.    Historical Provider, MD  benzonatate (TESSALON) 100 MG capsule Take 1 capsule (100 mg total) by mouth every 8 (eight) hours. 12/07/15   Ozella Almond  Ward, PA-C  cetirizine (ZYRTEC) 10 MG tablet Take 10 mg by mouth daily.    Saralyn Pilar, MD  cyclobenzaprine (FLEXERIL) 10 MG tablet Take 1 tablet (10 mg total) by mouth 2 (two) times daily as needed for muscle spasms. Patient not taking: Reported on 12/12/2014 08/07/14   Charlesetta Shanks, MD  guaiFENesin (ROBITUSSIN) 100 MG/5ML liquid Take 5-10 mLs (100-200 mg total) by mouth every 4 (four) hours as needed for cough. 12/17/15   Okey Regal, PA-C  HYDROcodone-acetaminophen (NORCO/VICODIN) 5-325 MG per tablet Take 1-2 tablets by mouth every 6 hours as needed for pain and/or cough. Patient not taking: Reported on 12/12/2014 10/15/14   Elmyra Ricks Pisciotta, PA-C  lisinopril (PRINIVIL,ZESTRIL) 10 MG tablet Take 10 mg by mouth daily. 05/18/13   Saralyn Pilar, MD  naproxen (NAPROSYN) 500 MG tablet Take 1 tablet (500 mg total) by mouth 2 (two) times daily. Patient not taking: Reported on 12/12/2014 08/07/14   Charlesetta Shanks, MD  omeprazole (PRILOSEC) 20 MG capsule Take 20 mg by mouth daily.    Saralyn Pilar, MD  PATADAY 0.2 % SOLN Apply 1 drop to eye as directed. Both eyes 05/18/13   Historical Provider, MD  promethazine (PHENERGAN) 25 MG tablet Take 1 tablet (25 mg total) by mouth every 6 (six)  hours as needed for nausea or vomiting. Patient not taking: Reported on 12/12/2014 10/15/14   Elmyra Ricks Pisciotta, PA-C   BP 105/61 mmHg  Pulse 71  Temp(Src) 97.4 F (36.3 C) (Oral)  Resp 18  SpO2 100%   Physical Exam  Constitutional: He is oriented to person, place, and time. He appears well-developed and well-nourished.  HENT:  Head: Normocephalic and atraumatic.  Eyes: Conjunctivae are normal. Pupils are equal, round, and reactive to light. Right eye exhibits no discharge. Left eye exhibits no discharge. No scleral icterus.  Neck: Normal range of motion. No JVD present. No tracheal deviation present.  Cardiovascular: Normal rate, regular rhythm, normal heart sounds and intact distal pulses.  Exam  reveals no gallop and no friction rub.   No murmur heard. Pulmonary/Chest: Effort normal and breath sounds normal. No stridor. No respiratory distress. He has no wheezes. He has no rales. He exhibits no tenderness.  Abdominal: Soft. He exhibits no distension and no mass. There is no tenderness. There is no rebound and no guarding.  Musculoskeletal: Normal range of motion. He exhibits no edema or tenderness.  Neurological: He is alert and oriented to person, place, and time. Coordination normal.  Skin: Skin is warm and dry. No rash noted. No erythema. No pallor.  Psychiatric: He has a normal mood and affect. His behavior is normal. Judgment and thought content normal.  Nursing note and vitals reviewed.   ED Course  Procedures (including critical care time) Labs Review Labs Reviewed - No data to display  Imaging Review No results found. I have personally reviewed and evaluated these images and lab results as part of my medical decision-making.   EKG Interpretation None      MDM   Final diagnoses:  Cough    Labs:  Imaging:  Consults:  Therapeutics:  Discharge Meds: Robitussin  Assessment/Plan: 60 year old well-appearing male in no acute distress. Patient is afebrile, nontoxic. Today's presentation is likely lingering cough from recent URI infection. He has no significant infectious etiology here today clear lung sounds, oxygenating at 100%. No need for further evaluation or management here in the ED, patient will be given cough medication, encouraged follow-up with his primary care provider for reevaluation. Patient is given strict return precautions, verbalized understanding and agreement for today's plan. Both the patient and his wife had no further questions or concerns at time of discharge.     Okey Regal, PA-C 12/17/15 [place], MD 12/18/15 [phone]

## 2015-12-17 NOTE — Discharge Instructions (Signed)

## 2015-12-23 ENCOUNTER — Ambulatory Visit (HOSPITAL_BASED_OUTPATIENT_CLINIC_OR_DEPARTMENT_OTHER): Payer: Medicare HMO | Admitting: Oncology

## 2015-12-23 ENCOUNTER — Other Ambulatory Visit (HOSPITAL_BASED_OUTPATIENT_CLINIC_OR_DEPARTMENT_OTHER): Payer: Medicare HMO

## 2015-12-23 ENCOUNTER — Telehealth: Payer: Self-pay | Admitting: Oncology

## 2015-12-23 ENCOUNTER — Telehealth: Payer: Self-pay | Admitting: *Deleted

## 2015-12-23 VITALS — BP 119/71 | HR 58 | Temp 97.8°F | Resp 189 | Ht 69.5 in | Wt 281.1 lb

## 2015-12-23 DIAGNOSIS — I10 Essential (primary) hypertension: Secondary | ICD-10-CM | POA: Diagnosis not present

## 2015-12-23 DIAGNOSIS — C189 Malignant neoplasm of colon, unspecified: Secondary | ICD-10-CM

## 2015-12-23 DIAGNOSIS — C187 Malignant neoplasm of sigmoid colon: Secondary | ICD-10-CM

## 2015-12-23 DIAGNOSIS — Z85038 Personal history of other malignant neoplasm of large intestine: Secondary | ICD-10-CM | POA: Diagnosis not present

## 2015-12-23 DIAGNOSIS — J45909 Unspecified asthma, uncomplicated: Secondary | ICD-10-CM

## 2015-12-23 NOTE — Progress Notes (Signed)
  [place] OFFICE PROGRESS NOTE   Diagnosis: Colon cancer  INTERVAL HISTORY:   Ralph Ryan returns as scheduled. He feels well. He has altered his diet. He no longer a to red meat or fried foods. He had a recent colonoscopy with Dr. Benson Norway (we do not have a report)  Objective:  Vital signs in last 24 hours:  Blood pressure 119/71, pulse 58, temperature 97.8 F (36.6 C), temperature source Oral, resp. rate 189, height 5' 9.5" (1.765 m), weight 281 lb 1.6 oz (127.506 kg), SpO2 100 %.    HEENT: Neck without mass Lymphatics: No cervical, supraclavicular, axillary, or inguinal nodes Resp: Lungs clear bilaterally Cardio: Regular rate and rhythm GI: No hepatosplenomegaly, no mass Vascular: No leg edema   Lab Results:   No results found for: CEA1  Medications: I have reviewed the patient's current medications.  Assessment/Plan: 1. 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 He began adjuvant Xeloda chemotherapy 06/10/2011. He completed the eighth and final cycle of adjuvant Xeloda chemotherapy beginning on 11/12/2011. -Surveillance colonoscopy August 2013 with removal of tubular adenomas and a hyperplastic polyp.  2. History of hand-foot syndrome secondary to Xeloda, improved with a dose reduction beginning with cycle #3. 3. Hypertension. 4. Asthma. 5. Legal blindness.   Disposition:  Ralph Ryan remains in clinical remission from colon cancer. He has not lost a significant amount of weight over the past several months. I suspect his weight loss over the past year is related to a change in his diet.  We  will follow-up on the CEA from today. He will return for an office visit and CEA in 8 months. We will follow-up on the recent colonoscopy by Dr. Benson Norway.  Betsy Coder, MD  12/23/2015  3:41 PM

## 2015-12-23 NOTE — Telephone Encounter (Signed)
Colonoscopy report requested and received from Dr. Ulyses Amor office. Results on Dr. Gearldine Shown desk for review.

## 2015-12-23 NOTE — Telephone Encounter (Signed)
Gave patient avs report and appointments for November.  °

## 2015-12-24 ENCOUNTER — Telehealth: Payer: Self-pay | Admitting: *Deleted

## 2015-12-24 LAB — CEA (PARALLEL TESTING): CEA: 1.7 ng/mL

## 2015-12-24 LAB — CEA: CEA: 4.2 ng/mL (ref 0.0–4.7)

## 2015-12-24 NOTE — Telephone Encounter (Signed)
-----   Message from Ladell Pier, MD sent at 12/24/2015  9:58 AM EST ----- Please call patient, cea is normal

## 2016-01-22 ENCOUNTER — Telehealth: Payer: Self-pay | Admitting: *Deleted

## 2016-01-22 DIAGNOSIS — C187 Malignant neoplasm of sigmoid colon: Secondary | ICD-10-CM

## 2016-01-22 NOTE — Telephone Encounter (Signed)
TC from patient regarding lab results-CEA- from March 2017. Informed patient that results are with in normal limits and that his next appt is in November 2017. No other needs identified.

## 2016-02-04 ENCOUNTER — Telehealth: Payer: Self-pay | Admitting: *Deleted

## 2016-02-04 NOTE — Telephone Encounter (Signed)
Attempted to call pt to discuss dietary questions. Noted he reports poor appetite.  No answer. (No need to avoid coffee in relation to his history of colon cancer.)  Will request dietician contact pt assist pt with dietary options.

## 2016-02-04 NOTE — Addendum Note (Signed)
Addended by: Brien Few on: 02/04/2016 04:52 PM   Modules accepted: Orders

## 2016-02-04 NOTE — Telephone Encounter (Signed)
"  Could someone tell me what I am supposed to eat for my colon cancer.  I'm down to 270 lbs, do not have an appetite.  I think I was told not to eat beef, chocolate and coffee but need to know what can I eat.  Return number [phone]." Offered nutrition consult but denies need would just like someone to call.  With this call instructed to eat fruits vegetables, fish, poultry and whole grains.  Avoid red processed meats, fried foods.  Teach back method used.  "Would still like a call back with further information and coffee information."

## 2016-02-05 ENCOUNTER — Telehealth: Payer: Self-pay | Admitting: Oncology

## 2016-02-05 NOTE — Telephone Encounter (Signed)
no vm...mailed pt appt sched and letter °

## 2016-02-12 ENCOUNTER — Encounter: Payer: Medicare HMO | Admitting: Nutrition

## 2016-02-18 ENCOUNTER — Ambulatory Visit: Payer: Medicare HMO | Admitting: Nutrition

## 2016-02-18 NOTE — Progress Notes (Signed)
Nutrition consult with patient 5 years out from colon cancer. Patient would like clarification on diet for survivorship Weight relatively stable and documented as 281 pounds. Patient is obese. Patient reports he does not consume red meat or coffee. He loves poultry and fish.  Provided diet education on diet after cancer.  Reviewed plant based diet and provided fact sheets.  Questions answered and teach back method used.

## 2016-08-19 ENCOUNTER — Telehealth: Payer: Self-pay | Admitting: *Deleted

## 2016-08-19 NOTE — Telephone Encounter (Signed)
Patient called and left message.  He wants to know when his appt. Is.  Called patient back.  His appt is Monday 11/6 at 1:15 for lab and 1:45 to see Ned Card.  Patient appreciated the call back.

## 2016-08-24 ENCOUNTER — Ambulatory Visit: Payer: Medicare HMO | Admitting: Nurse Practitioner

## 2016-08-24 ENCOUNTER — Other Ambulatory Visit: Payer: Medicare HMO

## 2016-08-25 ENCOUNTER — Telehealth: Payer: Self-pay | Admitting: Nurse Practitioner

## 2016-08-25 NOTE — Telephone Encounter (Signed)
11/6 Appointments rescheduled per patient request

## 2016-09-02 ENCOUNTER — Other Ambulatory Visit (HOSPITAL_BASED_OUTPATIENT_CLINIC_OR_DEPARTMENT_OTHER): Payer: Medicare HMO

## 2016-09-02 ENCOUNTER — Telehealth: Payer: Self-pay | Admitting: Oncology

## 2016-09-02 ENCOUNTER — Ambulatory Visit (HOSPITAL_BASED_OUTPATIENT_CLINIC_OR_DEPARTMENT_OTHER): Payer: Medicare HMO | Admitting: Nurse Practitioner

## 2016-09-02 VITALS — BP 138/57 | HR 50 | Temp 98.1°F | Resp 18 | Wt 312.6 lb

## 2016-09-02 DIAGNOSIS — C187 Malignant neoplasm of sigmoid colon: Secondary | ICD-10-CM

## 2016-09-02 DIAGNOSIS — Z23 Encounter for immunization: Secondary | ICD-10-CM | POA: Diagnosis not present

## 2016-09-02 LAB — CEA (IN HOUSE-CHCC): CEA (CHCC-In House): 2.06 ng/mL (ref 0.00–5.00)

## 2016-09-02 MED ORDER — INFLUENZA VAC SPLIT QUAD 0.5 ML IM SUSY
0.5000 mL | PREFILLED_SYRINGE | Freq: Once | INTRAMUSCULAR | Status: AC
  Administered 2016-09-02: 0.5 mL via INTRAMUSCULAR
  Filled 2016-09-02: qty 0.5

## 2016-09-02 NOTE — Progress Notes (Signed)
  [place] OFFICE PROGRESS NOTE   Diagnosis:  Colon cancer  INTERVAL HISTORY:   [name] returns as scheduled. He feels well. No change in bowel habits. No abdominal pain. He has a good appetite. He is gaining weight.  Objective:  Vital signs in last 24 hours:  Blood pressure (!) 138/57, pulse (!) 50, temperature 98.1 F (36.7 C), temperature source Oral, resp. rate 18, weight (!) 312 lb 9.6 oz (141.8 kg), SpO2 98 %.    HEENT: No neck mass. Lymphatics: No palpable cervical, supraclavicular, axillary or inguinal lymph nodes. Resp: Lungs clear bilaterally. Cardio: Regular rate and rhythm. GI: Abdomen soft and nontender. No hepatomegaly. No splenomegaly. No mass. Vascular: No leg edema.   Lab Results:  Lab Results  Component Value Date   WBC 4.9 12/07/2015   HGB 11.6 (L) 12/07/2015   HCT 35.7 (L) 12/07/2015   MCV 89.9 12/07/2015   PLT 209 12/07/2015   NEUTROABS 3.3 10/24/2012    Imaging:  No results found.  Medications: I have reviewed the patient's current medications.  Assessment/Plan: 1. 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 He began adjuvant Xeloda chemotherapy 06/10/2011. He completed the eighth and final cycle of adjuvant Xeloda chemotherapy beginning on 11/12/2011. -Surveillance colonoscopy August 2013 with removal of tubular adenomas and a hyperplastic polyp.  -Colonoscopy 06/13/2015-two 1-2 mm polyps in the rectum and in the ascending colon; repeat colonoscopy in 5 years for surveillance 2. History of hand-foot syndrome secondary to Xeloda, improved with a dose reduction beginning with cycle #3. 3. Hypertension. 4. Asthma. 5. Legal blindness.   Disposition: Ralph Ryan remains in clinical remission from colon cancer. We will follow-up on the CEA from today. He is now greater than 5 years out from the diagnosis. We discussed releasing him to follow-up with  his PCP versus continuing follow-up here on an annual basis. He will return for a follow-up visit and CEA in one year.  We administered the influenza vaccine at today's visit.  Ned Card ANP/GNP-BC   09/02/2016  10:35 AM

## 2016-09-02 NOTE — Telephone Encounter (Signed)
Appointments scheduled per 09/02/16 los. AVS report and appointment schedule given to patient per 09/02/16 los. °

## 2016-09-02 NOTE — Patient Instructions (Signed)
Influenza Virus Vaccine (Flucelvax) What is this medicine? INFLUENZA VIRUS VACCINE (in floo EN zuh VAHY ruhs vak SEEN) helps to reduce the risk of getting influenza also known as the flu. The vaccine only helps protect you against some strains of the flu. COMMON BRAND NAME(S): FLUCELVAX What should I tell my health care provider before I take this medicine? They need to know if you have any of these conditions: -bleeding disorder like hemophilia -fever or infection -Guillain-Barre syndrome or other neurological problems -immune system problems -infection with the human immunodeficiency virus (HIV) or AIDS -low blood platelet counts -multiple sclerosis -an unusual or allergic reaction to influenza virus vaccine, other medicines, foods, dyes or preservatives -pregnant or trying to get pregnant -breast-feeding How should I use this medicine? This vaccine is for injection into a muscle. It is given by a health care professional. A copy of Vaccine Information Statements will be given before each vaccination. Read this sheet carefully each time. The sheet may change frequently. Talk to your pediatrician regarding the use of this medicine in children. Special care may be needed. Overdosage: If you think you've taken too much of this medicine contact a poison control center or emergency room at once. What if I miss a dose? This does not apply. What may interact with this medicine? -chemotherapy or radiation therapy -medicines that lower your immune system like etanercept, anakinra, infliximab, and adalimumab -medicines that treat or prevent blood clots like warfarin -phenytoin -steroid medicines like prednisone or cortisone -theophylline -vaccines What should I watch for while using this medicine? Report any side effects that do not go away within 3 days to your doctor or health care professional. Call your health care provider if any unusual symptoms occur within 6 weeks of receiving this  vaccine. You may still catch the flu, but the illness is not usually as bad. You cannot get the flu from the vaccine. The vaccine will not protect against colds or other illnesses that may cause fever. The vaccine is needed every year. What side effects may I notice from receiving this medicine? Side effects that you should report to your doctor or health care professional as soon as possible: -allergic reactions like skin rash, itching or hives, swelling of the face, lips, or tongue Side effects that usually do not require medical attention (Report these to your doctor or health care professional if they continue or are bothersome.): -fever -headache -muscle aches and pains -pain, tenderness, redness, or swelling at the injection site -tiredness Where should I keep my medicine? The vaccine will be given by a health care professional in a clinic, pharmacy, doctor's office, or other health care setting. You will not be given vaccine doses to store at home.  2017 Elsevier/Gold Standard (2011-09-16 14:06:47)  

## 2016-09-03 LAB — CEA: CEA: 3.3 ng/mL (ref 0.0–4.7)

## 2016-09-04 ENCOUNTER — Telehealth: Payer: Self-pay | Admitting: *Deleted

## 2016-09-04 ENCOUNTER — Telehealth: Payer: Self-pay

## 2016-09-04 NOTE — Telephone Encounter (Signed)
-----   Message from Ladell Pier, MD sent at 09/03/2016  8:33 PM EST ----- Please call patient, cea is normal

## 2016-09-04 NOTE — Telephone Encounter (Signed)
Per Dr. Sherrill, patient notified that cea is normal.  Patient appreciative of call and has no questions at this time. 

## 2016-12-04 ENCOUNTER — Emergency Department (HOSPITAL_COMMUNITY)
Admission: EM | Admit: 2016-12-04 | Discharge: 2016-12-04 | Disposition: A | Discharge status: Home / Self Care | Payer: Medicare HMO | Attending: Emergency Medicine | Admitting: Emergency Medicine

## 2016-12-04 ENCOUNTER — Encounter (HOSPITAL_COMMUNITY): Payer: Self-pay | Admitting: Emergency Medicine

## 2016-12-04 DIAGNOSIS — Z79899 Other long term (current) drug therapy: Secondary | ICD-10-CM | POA: Diagnosis not present

## 2016-12-04 DIAGNOSIS — N390 Urinary tract infection, site not specified: Secondary | ICD-10-CM | POA: Diagnosis present

## 2016-12-04 DIAGNOSIS — J45909 Unspecified asthma, uncomplicated: Secondary | ICD-10-CM | POA: Diagnosis not present

## 2016-12-04 DIAGNOSIS — I10 Essential (primary) hypertension: Secondary | ICD-10-CM | POA: Insufficient documentation

## 2016-12-04 DIAGNOSIS — R350 Frequency of micturition: Secondary | ICD-10-CM | POA: Diagnosis not present

## 2016-12-04 LAB — I-STAT CHEM 8, ED
BUN: 17 mg/dL (ref 6–20)
CALCIUM ION: 1.24 mmol/L (ref 1.15–1.40)
CREATININE: 1.1 mg/dL (ref 0.61–1.24)
Chloride: 105 mmol/L (ref 101–111)
Glucose, Bld: 113 mg/dL — ABNORMAL HIGH (ref 65–99)
HCT: 38 % — ABNORMAL LOW (ref 39.0–52.0)
HEMOGLOBIN: 12.9 g/dL — AB (ref 13.0–17.0)
Potassium: 4.2 mmol/L (ref 3.5–5.1)
SODIUM: 141 mmol/L (ref 135–145)
TCO2: 26 mmol/L (ref 0–100)

## 2016-12-04 LAB — URINALYSIS, ROUTINE W REFLEX MICROSCOPIC
BILIRUBIN URINE: NEGATIVE
GLUCOSE, UA: NEGATIVE mg/dL
HGB URINE DIPSTICK: NEGATIVE
KETONES UR: NEGATIVE mg/dL
Leukocytes, UA: NEGATIVE
Nitrite: NEGATIVE
PROTEIN: NEGATIVE mg/dL
SPECIFIC GRAVITY, URINE: 1.021 (ref 1.005–1.030)
pH: 5 (ref 5.0–8.0)

## 2016-12-04 LAB — CBG MONITORING, ED: Glucose-Capillary: 113 mg/dL — ABNORMAL HIGH (ref 65–99)

## 2016-12-04 NOTE — ED Notes (Signed)
Post Void Bladder Scan = 33ml

## 2016-12-04 NOTE — ED Provider Notes (Signed)
[facility] DEPT Provider Note   CSN: [id number] Arrival date & time: 12/04/16  [id number]     History   Chief Complaint Chief Complaint  Patient presents with  . Urinary Tract Infection    HPI Ralph Ryan is a 61 y.o. male.  HPI  61 year old male presents with urinary frequency and occasional incontinence over the last 3 days. Incontinence only happens when he is walking and he will all of a sudden noticed that he has urinated a little bit in his underwear. There is no burning. Typically when he urinates he urinates large amount. Has been drinking plenty of fluids. There is no abdominal pain. States he has chronic low back pain from a prior injury but denies any new or worsening symptoms. No weakness or numbness in his extremities. No penile pain, swelling, or testicular pain. No known prostate problems  Past Medical History:  Diagnosis Date  . Abdominal pain   . Allergy   . Asthma    Patient states he does not have asthma  . Cancer ([place])    colon  . Hypertension   . Legal blindness     Patient Active Problem List   Diagnosis Date Noted  . Colon cancer ([place]) 04/16/2011  . Hypertension 04/16/2011  . Obesity 04/16/2011    Past Surgical History:  Procedure Laterality Date  . COLECTOMY  05/06/11   lap assist sigmoid colectomy  . COLON SURGERY    . FOOT SURGERY    . HAND SURGERY    . KNEE SURGERY         Home Medications    Prior to Admission medications   Medication Sig Start Date End Date Taking? Authorizing Provider  amLODipine (NORVASC) 10 MG tablet Take 10 mg by mouth daily.    Historical Provider, MD  guaiFENesin (ROBITUSSIN) 100 MG/5ML liquid Take 5-10 mLs (100-200 mg total) by mouth every 4 (four) hours as needed for cough. Patient not taking: Reported on 09/02/2016 12/17/15   Okey Regal, PA-C  lisinopril (PRINIVIL,ZESTRIL) 10 MG tablet Take 10 mg by mouth daily. 05/18/13   Saralyn Pilar, MD  naproxen (NAPROSYN) 500 MG tablet Take 1 tablet (500  mg total) by mouth 2 (two) times daily. Patient not taking: Reported on 09/02/2016 08/07/14   Charlesetta Shanks, MD    Family History Family History  Problem Relation Age of Onset  . Cancer Mother     Social History Social History  Substance Use Topics  . Smoking status: Never Smoker  . Smokeless tobacco: Never Used  . Alcohol use No     Allergies   Codeine; Penicillins; and Aspirin   Review of Systems Review of Systems  Constitutional: Negative for fever.  Gastrointestinal: Negative for abdominal pain, nausea and vomiting.  Genitourinary: Positive for frequency. Negative for dysuria, flank pain, hematuria, penile pain, penile swelling, scrotal swelling and testicular pain.  Neurological: Negative for weakness.  All other systems reviewed and are negative.    Physical Exam Updated Vital Signs BP 118/68 (BP Location: Right Arm)   Pulse 92   Temp 98.3 F (36.8 C) (Oral)   Resp 18   SpO2 98%   Physical Exam  Constitutional: He is oriented to person, place, and time. He appears well-developed and well-nourished. No distress.  Morbidly obese  HENT:  Head: Normocephalic and atraumatic.  Right Ear: External ear normal.  Left Ear: External ear normal.  Nose: Nose normal.  Eyes: Right eye exhibits no discharge. Left eye exhibits no discharge.  Neck:  Neck supple.  Cardiovascular: Normal rate, regular rhythm and normal heart sounds.   Pulmonary/Chest: Effort normal and breath sounds normal.  Abdominal: Soft. There is no tenderness. There is no CVA tenderness.  Genitourinary:  Genitourinary Comments: Difficult exam but no prostate tenderness. Normal rectal tone. Difficult to fully appreciate size of prostate. Not boggy.  Musculoskeletal: He exhibits no edema.  Neurological: He is alert and oriented to person, place, and time.  Skin: Skin is warm and dry. He is not diaphoretic.  Nursing note and vitals reviewed.    ED Treatments / Results  Labs (all labs ordered are  listed, but only abnormal results are displayed) Labs Reviewed  CBG MONITORING, ED - Abnormal; Notable for the following:       Result Value   Glucose-Capillary 113 (*)    All other components within normal limits  I-STAT CHEM 8, ED - Abnormal; Notable for the following:    Glucose, Bld 113 (*)    Hemoglobin 12.9 (*)    HCT 38.0 (*)    All other components within normal limits  URINALYSIS, ROUTINE W REFLEX MICROSCOPIC    EKG  EKG Interpretation None       Radiology No results found.  Procedures Procedures (including critical care time)  Medications Ordered in ED Medications - No data to display   Initial Impression / Assessment and Plan / ED Course  I have reviewed the triage vital signs and the nursing notes.  Pertinent labs & imaging results that were available during my care of the patient were reviewed by me and considered in my medical decision making (see chart for details).  Clinical Course as of Dec 05 1427  Fri Dec 04, 2016  1209 Urine not consistent with UTI. Overall well appearing. Morbidly obese but no abdominal tenderness. No new or worsening back pain. Has chronic mild back pain but states is not worse than typical. Will evaluate for retention and check CBG and Chem-8.  [SG]    Clinical Course User Index [SG] Ralph Gambler, MD    CBG unremarkable. Renal function benign. Postvoid residual was only 11 mL. No obvious reason for his increased urine frequency and occasional incontinence. No weakness or numbness in his extremities. No focal findings, will refer to urology. Discussed return precautions.  Final Clinical Impressions(s) / ED Diagnoses   Final diagnoses:  Urinary frequency    New Prescriptions Discharge Medication List as of 12/04/2016  1:16 PM       Ralph Gambler, MD 12/04/16 1430

## 2016-12-04 NOTE — ED Notes (Signed)
ED Provider at bedside. 

## 2016-12-04 NOTE — ED Triage Notes (Signed)
Pt sts thinks he has UTI because he has urinary frequency x 3 days

## 2017-01-13 ENCOUNTER — Encounter (HOSPITAL_COMMUNITY): Payer: Self-pay | Admitting: Emergency Medicine

## 2017-01-13 ENCOUNTER — Emergency Department (HOSPITAL_COMMUNITY)
Admission: EM | Admit: 2017-01-13 | Discharge: 2017-01-13 | Disposition: A | Discharge status: Home / Self Care | Payer: Medicare HMO | Attending: Emergency Medicine | Admitting: Emergency Medicine

## 2017-01-13 DIAGNOSIS — J45909 Unspecified asthma, uncomplicated: Secondary | ICD-10-CM | POA: Insufficient documentation

## 2017-01-13 DIAGNOSIS — Z85038 Personal history of other malignant neoplasm of large intestine: Secondary | ICD-10-CM | POA: Diagnosis not present

## 2017-01-13 DIAGNOSIS — M545 Low back pain: Secondary | ICD-10-CM | POA: Diagnosis not present

## 2017-01-13 DIAGNOSIS — Z79899 Other long term (current) drug therapy: Secondary | ICD-10-CM | POA: Insufficient documentation

## 2017-01-13 DIAGNOSIS — I10 Essential (primary) hypertension: Secondary | ICD-10-CM | POA: Insufficient documentation

## 2017-01-13 DIAGNOSIS — M549 Dorsalgia, unspecified: Secondary | ICD-10-CM

## 2017-01-13 LAB — COMPREHENSIVE METABOLIC PANEL
ALT: 15 U/L — AB (ref 17–63)
ANION GAP: 10 (ref 5–15)
AST: 25 U/L (ref 15–41)
Albumin: 4.1 g/dL (ref 3.5–5.0)
Alkaline Phosphatase: 62 U/L (ref 38–126)
BILIRUBIN TOTAL: 0.7 mg/dL (ref 0.3–1.2)
BUN: 19 mg/dL (ref 6–20)
CO2: 26 mmol/L (ref 22–32)
CREATININE: 1.21 mg/dL (ref 0.61–1.24)
Calcium: 9.6 mg/dL (ref 8.9–10.3)
Chloride: 102 mmol/L (ref 101–111)
GFR calc non Af Amer: >60 mL/min (ref >60)
Glucose, Bld: 109 mg/dL — ABNORMAL HIGH (ref 65–99)
Potassium: 4.2 mmol/L (ref 3.5–5.1)
Sodium: 138 mmol/L (ref 135–145)
TOTAL PROTEIN: 8.6 g/dL — AB (ref 6.5–8.1)

## 2017-01-13 LAB — URINALYSIS, ROUTINE W REFLEX MICROSCOPIC
BILIRUBIN URINE: NEGATIVE
Glucose, UA: NEGATIVE mg/dL
HGB URINE DIPSTICK: NEGATIVE
Ketones, ur: 5 mg/dL — AB
Leukocytes, UA: NEGATIVE
NITRITE: NEGATIVE
PROTEIN: NEGATIVE mg/dL
SPECIFIC GRAVITY, URINE: 1.02 (ref 1.005–1.030)
pH: 5 (ref 5.0–8.0)

## 2017-01-13 LAB — CBC
HCT: 41.4 % (ref 39.0–52.0)
HEMOGLOBIN: 13.4 g/dL (ref 13.0–17.0)
MCH: 28.8 pg (ref 26.0–34.0)
MCHC: 32.4 g/dL (ref 30.0–36.0)
MCV: 89 fL (ref 78.0–100.0)
PLATELETS: 276 10*3/uL (ref 150–400)
RBC: 4.65 MIL/uL (ref 4.22–5.81)
RDW: 13.7 % (ref 11.5–15.5)
WBC: 5.5 10*3/uL (ref 4.0–10.5)

## 2017-01-13 LAB — LIPASE, BLOOD: Lipase: <10 U/L — ABNORMAL LOW (ref 11–51)

## 2017-01-13 MED ORDER — ACETAMINOPHEN 500 MG PO TABS
1000.0000 mg | ORAL_TABLET | Freq: Once | ORAL | Status: AC
  Administered 2017-01-13: 1000 mg via ORAL
  Filled 2017-01-13: qty 2

## 2017-01-13 MED ORDER — OXYCODONE HCL 5 MG PO TABS
5.0000 mg | ORAL_TABLET | Freq: Once | ORAL | Status: AC
  Administered 2017-01-13: 5 mg via ORAL
  Filled 2017-01-13: qty 1

## 2017-01-13 MED ORDER — IBUPROFEN 800 MG PO TABS
800.0000 mg | ORAL_TABLET | Freq: Once | ORAL | Status: AC
  Administered 2017-01-13: 800 mg via ORAL
  Filled 2017-01-13: qty 1
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
## 2017-01-13 NOTE — Discharge Instructions (Signed)
Take 4 over the counter ibuprofen tablets 3 times a day or 2 over-the-counter naproxen tablets twice a day for pain. Also take tylenol 1000mg (2 extra strength) four times a day.    Discuss with your family doc as this has been going on for quite some time and they will need to continue to follow you to make sure it gets better.

## 2017-01-13 NOTE — ED Triage Notes (Signed)
Pt here for left mid abd to flank pain x 3 months worse with palpation

## 2017-06-08 ENCOUNTER — Encounter (HOSPITAL_COMMUNITY): Payer: Self-pay

## 2017-06-08 DIAGNOSIS — Z85038 Personal history of other malignant neoplasm of large intestine: Secondary | ICD-10-CM | POA: Insufficient documentation

## 2017-06-08 DIAGNOSIS — H548 Legal blindness, as defined in USA: Secondary | ICD-10-CM | POA: Diagnosis not present

## 2017-06-08 DIAGNOSIS — M25562 Pain in left knee: Secondary | ICD-10-CM | POA: Insufficient documentation

## 2017-06-08 DIAGNOSIS — I10 Essential (primary) hypertension: Secondary | ICD-10-CM | POA: Diagnosis not present

## 2017-06-08 DIAGNOSIS — M545 Low back pain: Secondary | ICD-10-CM | POA: Diagnosis present

## 2017-06-08 NOTE — ED Triage Notes (Signed)
Pt endorses lower back pain that began after carrying a duffle bag with heavy objects earlier today. Denies any neuro sx or incontinence. Ambulatory.

## 2017-06-09 ENCOUNTER — Emergency Department (HOSPITAL_COMMUNITY): Payer: Medicare HMO

## 2017-06-09 ENCOUNTER — Emergency Department (HOSPITAL_COMMUNITY)
Admission: EM | Admit: 2017-06-09 | Discharge: 2017-06-09 | Disposition: A | Discharge status: Home / Self Care | Payer: Medicare HMO | Attending: Emergency Medicine | Admitting: Emergency Medicine

## 2017-06-09 DIAGNOSIS — M545 Low back pain, unspecified: Secondary | ICD-10-CM

## 2017-06-09 DIAGNOSIS — M25562 Pain in left knee: Secondary | ICD-10-CM

## 2017-06-09 MED ORDER — ACETAMINOPHEN 500 MG PO TABS: 1000.0000 mg | ORAL_TABLET | Freq: Three times a day (TID) | ORAL | 0 refills | Status: DC | PRN

## 2017-06-09 MED ORDER — ACETAMINOPHEN 500 MG PO TABS
1000.0000 mg | ORAL_TABLET | Freq: Once | ORAL | Status: AC
Start: 2017-06-09 — End: 2017-06-09
  Administered 2017-06-09: 1000 mg via ORAL
  Filled 2017-06-09: qty 2

## 2017-06-09 NOTE — Discharge Instructions (Signed)
1. Medications: tylenol for pain, usual home medications °2. Treatment: rest, drink plenty of fluids, gentle stretching as discussed, alternate ice and heat °3. Follow Up: Please followup with your primary doctor in 3 days for discussion of your diagnoses and further evaluation after today's visit; if you do not have a primary care doctor use the resource guide provided to find one;  Return to the ER for worsening back pain, difficulty walking, loss of bowel or bladder control or other concerning symptoms ° ° ° ° ° °

## 2017-06-09 NOTE — ED Provider Notes (Signed)
[facility] DEPT Provider Note   CSN: [id number] Arrival date & time: 06/08/17  2155     History   Chief Complaint Chief Complaint  Patient presents with  . Back Pain    HPI Ralph Ryan is a 61 y.o. male with a hx of Colon cancer, hypertension, legal blindness, asthma, allergy presents to the Emergency Department complaining of gradual, persistent, progressively worsening low back pain onset earlier this afternoon after lifting a heavy duffel bag and wearing it over his shoulder while walking. Patient reports that he has bilateral lower back pain that radiates down into his hips but not beyond that. No treatments prior to arrival. Patient denies numbness or weakness in his legs, loss of bowel or bladder control. He reports that last week each prescription, falling 40 and stretching his left knee. He reports pain since that time. Patient reports he has been able to walk with both his knee pain and back pain without difficulty but walking doesn't exacerbate his pain. Nothing seems to make it better.   The history is provided by the patient and medical records. No language interpreter was used.    Past Medical History:  Diagnosis Date  . Abdominal pain   . Allergy   . Asthma    Patient states he does not have asthma  . Cancer ([place])    colon  . Hypertension   . Legal blindness     Patient Active Problem List   Diagnosis Date Noted  . Colon cancer ([place]) 04/16/2011  . Hypertension 04/16/2011  . Obesity 04/16/2011    Past Surgical History:  Procedure Laterality Date  . COLECTOMY  05/06/11   lap assist sigmoid colectomy  . COLON SURGERY    . FOOT SURGERY    . HAND SURGERY    . KNEE SURGERY         Home Medications    Prior to Admission medications   Medication Sig Start Date End Date Taking? Authorizing Provider  acetaminophen (TYLENOL) 500 MG tablet Take 2 tablets (1,000 mg total) by mouth every 8 (eight) hours as needed for mild pain or moderate pain. DO NOT  EXCEED 4,000mg  in 24 hours 06/09/17   Reniya Mcclees, Jarrett Soho, PA-C  amLODipine (NORVASC) 10 MG tablet Take 10 mg by mouth daily.    [provider]  guaiFENesin (ROBITUSSIN) 100 MG/5ML liquid Take 5-10 mLs (100-200 mg total) by mouth every 4 (four) hours as needed for cough. Patient not taking: Reported on 09/02/2016 12/17/15   Hedges, Dellis Filbert, PA-C  lisinopril (PRINIVIL,ZESTRIL) 10 MG tablet Take 10 mg by mouth daily. 05/18/13   Powers, Charolotte Eke, MD  naproxen (NAPROSYN) 500 MG tablet Take 1 tablet (500 mg total) by mouth 2 (two) times daily. Patient not taking: Reported on 09/02/2016 08/07/14   Charlesetta Shanks, MD    Family History Family History  Problem Relation Age of Onset  . Cancer Mother     Social History Social History  Substance Use Topics  . Smoking status: Never Smoker  . Smokeless tobacco: Never Used  . Alcohol use No     Allergies   Codeine; Penicillins; and Aspirin   Review of Systems Review of Systems  Constitutional: Negative for appetite change, diaphoresis, fatigue, fever and unexpected weight change.  HENT: Negative for mouth sores.   Eyes: Negative for visual disturbance.  Respiratory: Negative for cough, chest tightness, shortness of breath and wheezing.   Cardiovascular: Negative for chest pain.  Gastrointestinal: Negative for abdominal pain, constipation, diarrhea, nausea and  vomiting.  Endocrine: Negative for polydipsia, polyphagia and polyuria.  Genitourinary: Negative for dysuria, frequency, hematuria and urgency.  Musculoskeletal: Positive for arthralgias and back pain. Negative for neck stiffness.  Skin: Negative for rash.  Allergic/Immunologic: Negative for immunocompromised state.  Neurological: Negative for syncope, light-headedness and headaches.  Hematological: Does not bruise/bleed easily.  Psychiatric/Behavioral: Negative for sleep disturbance. The patient is not nervous/anxious.   All other systems reviewed and are  negative.    Physical Exam Updated Vital Signs BP 120/72   Pulse 77   Temp 97.7 F (36.5 C) (Oral)   Resp 16   Ht 5\' 10"  (1.778 m)   Wt 117.9 kg (260 lb)   SpO2 97%   BMI 37.31 kg/m   Physical Exam  Constitutional: He appears well-developed and well-nourished. No distress.  Awake, alert, nontoxic appearance  HENT:  Head: Normocephalic and atraumatic.  Mouth/Throat: Oropharynx is clear and moist. No oropharyngeal exudate.  Eyes: Conjunctivae are normal. No scleral icterus.  Neck: Normal range of motion. Neck supple.  Full ROM without pain  Cardiovascular: Normal rate, regular rhythm and intact distal pulses.   Pulmonary/Chest: Effort normal and breath sounds normal. No respiratory distress. He has no wheezes.  Equal chest expansion  Abdominal: Soft. Bowel sounds are normal. He exhibits no distension and no mass. There is no tenderness. There is no rigidity, no rebound, no guarding and no CVA tenderness.  No palpable pulsatile mass  Musculoskeletal: Normal range of motion. He exhibits no edema.       Left knee: He exhibits normal range of motion. Tenderness ( Global) found.  Full range of motion of the T-spine and L-spine No midline tenderness to the  T-spine or L-spine Tenderness to palpation of the paraspinous muscles of the L-spine Left knee with full range of motion. No large joint effusion. Small abrasion to the proximal tibia.  No erythema, induration or purulent drainage. No abnormal patellar movement. Patellar tendon intact.  Lymphadenopathy:    He has no cervical adenopathy.  Neurological: He is alert.  Speech is clear and goal oriented, follows commands Normal 5/5 strength in upper and lower extremities bilaterally including dorsiflexion and plantar flexion, strong and equal grip strength Sensation normal to light and sharp touch Moves extremities without ataxia, coordination intact Antalgic gait Normal balance No Clonus  Skin: Skin is warm and dry. No rash  noted. He is not diaphoretic. No erythema.  Psychiatric: He has a normal mood and affect. His behavior is normal.  Nursing note and vitals reviewed.    ED Treatments / Results  Labs (all labs ordered are listed, but only abnormal results are displayed) Labs Reviewed - No data to display  EKG  EKG Interpretation None       Radiology Dg Lumbar Spine Complete  Result Date: 06/09/2017 CLINICAL DATA:  Lumbosacral back pain today.  Fall last week. EXAM: LUMBAR SPINE - COMPLETE 4+ VIEW COMPARISON:  CT 04/08/2011 FINDINGS: Chronic grade 1 anterolisthesis of L4 on L5, degenerative. Alignment is otherwise maintained. Vertebral body heights are normal. No acute fracture. Disc space narrowing at L4-L5 and L5-S1. Advanced multilevel facet arthropathy. Sacroiliac joints are congruent. IMPRESSION: Multilevel degenerative change throughout the lumbar spine without acute fracture. Electronically Signed   By: Jeb Levering M.D.   On: 06/09/2017 04:32   Dg Knee Complete 4 Views Left  Result Date: 06/09/2017 CLINICAL DATA:  Left knee pain after fall. EXAM: LEFT KNEE - COMPLETE 4+ VIEW COMPARISON:  None. FINDINGS: No evidence of fracture, dislocation,  or joint effusion. Mild medial tibia femoral peripheral spurring. Minimal spurring of tibial spines. Quadriceps tendon enthesophyte. Soft tissues are unremarkable. IMPRESSION: Mild degenerative change without acute fracture or dislocation. Electronically Signed   By: Jeb Levering M.D.   On: 06/09/2017 04:33    Procedures Procedures (including critical care time)  Medications Ordered in ED Medications  acetaminophen (TYLENOL) tablet 1,000 mg (1,000 mg Oral Given 06/09/17 4695)     Initial Impression / Assessment and Plan / ED Course  I have reviewed the triage vital signs and the nursing notes.  Pertinent labs & imaging results that were available during my care of the patient were reviewed by me and considered in my medical decision making (see  chart for details).  Clinical Course as of Jun 09 526  Wed Jun 09, 2017  0524 Pt pain improved with tylenol.  He remains ambulatory without difficulty in the ED.   [HM]    Clinical Course User Index [HM] Naryah Ryan, Jarrett Soho, PA-C    Patient X-Ray negative for obvious fracture or dislocation. Pain managed in ED. Pt advised to follow up with orthopedics if symptoms persist for possibility of missed fracture diagnosis. Conservative therapy recommended and discussed.   Patient Also with back pain.  No neurological deficits and normal neuro exam.  Patient can walk but states is painful.  No loss of bowel or bladder control.  No concern for cauda equina.  No fever, night sweats, weight loss, IVDU.  Patient does have a history of cancer however films are without evidence of metastatic disease. RICE protocol and Tylenol indicated and discussed with patient. Discussed reasons to return immediately to the emergency department. Patient states understanding and is in agreement with the plan.   Final Clinical Impressions(s) / ED Diagnoses   Final diagnoses:  Acute bilateral low back pain without sciatica  Acute pain of left knee    New Prescriptions New Prescriptions   ACETAMINOPHEN (TYLENOL) 500 MG TABLET    Take 2 tablets (1,000 mg total) by mouth every 8 (eight) hours as needed for mild pain or moderate pain. DO NOT EXCEED 4,000mg  in 24 hours     Loni Muse Gwenlyn Perking 06/09/17 0722    Orpah Greek, MD 06/09/17 [phone]

## 2017-09-02 ENCOUNTER — Ambulatory Visit: Payer: Medicare HMO | Admitting: Oncology

## 2017-09-02 ENCOUNTER — Other Ambulatory Visit: Payer: Medicare HMO

## 2017-11-03 ENCOUNTER — Other Ambulatory Visit: Payer: Self-pay

## 2017-11-03 ENCOUNTER — Emergency Department (HOSPITAL_COMMUNITY)
Admission: EM | Admit: 2017-11-03 | Discharge: 2017-11-03 | Disposition: A | Discharge status: Home / Self Care | Payer: Medicare Other | Attending: Emergency Medicine | Admitting: Emergency Medicine

## 2017-11-03 ENCOUNTER — Emergency Department (HOSPITAL_COMMUNITY): Payer: Medicare Other

## 2017-11-03 ENCOUNTER — Encounter (HOSPITAL_COMMUNITY): Payer: Self-pay | Admitting: *Deleted

## 2017-11-03 DIAGNOSIS — I10 Essential (primary) hypertension: Secondary | ICD-10-CM | POA: Diagnosis not present

## 2017-11-03 DIAGNOSIS — R05 Cough: Secondary | ICD-10-CM | POA: Diagnosis present

## 2017-11-03 DIAGNOSIS — Z79899 Other long term (current) drug therapy: Secondary | ICD-10-CM | POA: Diagnosis not present

## 2017-11-03 DIAGNOSIS — B349 Viral infection, unspecified: Secondary | ICD-10-CM | POA: Insufficient documentation

## 2017-11-03 MED ORDER — AZITHROMYCIN 250 MG PO TABS: 250.0000 mg | ORAL_TABLET | Freq: Every day | ORAL | 0 refills | Status: DC

## 2017-11-03 MED ORDER — FLUTICASONE PROPIONATE 50 MCG/ACT NA SUSP: 1.0000 | Freq: Every day | NASAL | 2 refills | Status: DC

## 2017-11-03 MED ORDER — BENZONATATE 100 MG PO CAPS: 100.0000 mg | ORAL_CAPSULE | Freq: Three times a day (TID) | ORAL | 0 refills | Status: DC | PRN

## 2017-11-03 NOTE — ED Triage Notes (Signed)
Pt reports a productive cough x 2 days with white sputum. Denies fever. Hx of bronchitis.

## 2017-11-03 NOTE — ED Provider Notes (Signed)
[facility] EMERGENCY DEPARTMENT Provider Note   CSN: [id number] Arrival date & time: 11/03/17  7673     History   Chief Complaint Chief Complaint  Patient presents with  . Cough    HPI Ralph Ryan is a 62 y.o. male with a hx of asthma and HTN who presents to the ED complaining of worsening cough x 3 days. Describes the cough as productive with white sputum. Reports associated congestion and sore throat. States one time he felt he was wheezing, otherwise has not. Tried cough drops without relief. No other alleviating/aggravating factors. Denies fever, chills, dyspnea, chest pain, or N/V/D.   HPI  Past Medical History:  Diagnosis Date  . Abdominal pain   . Allergy   . Asthma    Patient states he does not have asthma  . Cancer ([place])    colon  . Hypertension   . Legal blindness     Patient Active Problem List   Diagnosis Date Noted  . Colon cancer ([place]) 04/16/2011  . Hypertension 04/16/2011  . Obesity 04/16/2011    Past Surgical History:  Procedure Laterality Date  . COLECTOMY  05/06/11   lap assist sigmoid colectomy  . COLON SURGERY    . FOOT SURGERY    . HAND SURGERY    . KNEE SURGERY         Home Medications    Prior to Admission medications   Medication Sig Start Date End Date Taking? Authorizing Provider  acetaminophen (TYLENOL) 500 MG tablet Take 2 tablets (1,000 mg total) by mouth every 8 (eight) hours as needed for mild pain or moderate pain. DO NOT EXCEED 4,000mg  in 24 hours 06/09/17   Muthersbaugh, Jarrett Soho, PA-C  amLODipine (NORVASC) 10 MG tablet Take 10 mg by mouth daily.    [provider]  guaiFENesin (ROBITUSSIN) 100 MG/5ML liquid Take 5-10 mLs (100-200 mg total) by mouth every 4 (four) hours as needed for cough. Patient not taking: Reported on 09/02/2016 12/17/15   Hedges, Dellis Filbert, PA-C  lisinopril (PRINIVIL,ZESTRIL) 10 MG tablet Take 10 mg by mouth daily. 05/18/13   Powers, Charolotte Eke, MD  naproxen (NAPROSYN) 500  MG tablet Take 1 tablet (500 mg total) by mouth 2 (two) times daily. Patient not taking: Reported on 09/02/2016 08/07/14   Charlesetta Shanks, MD    Family History Family History  Problem Relation Age of Onset  . Cancer Mother     Social History Social History   Tobacco Use  . Smoking status: Never Smoker  . Smokeless tobacco: Never Used  Substance Use Topics  . Alcohol use: No  . Drug use: No     Allergies   Codeine; Penicillins; and Aspirin   Review of Systems Review of Systems  Constitutional: Negative for chills and fever.  HENT: Positive for congestion and sore throat. Negative for ear pain.   Respiratory: Positive for cough and wheezing (once, not at present). Negative for shortness of breath.   Cardiovascular: Negative for chest pain, palpitations and leg swelling.  Gastrointestinal: Negative for abdominal pain, diarrhea, nausea and vomiting.  All other systems reviewed and are negative.   Physical Exam Updated Vital Signs BP 123/75 (BP Location: Left Arm)   Pulse 88   Temp (!) 97.4 F (36.3 C) (Oral)   Resp 16   SpO2 97%   Physical Exam  Constitutional: He appears well-developed and well-nourished.  Non-toxic appearance. No distress.  HENT:  Head: Normocephalic and atraumatic.  Right Ear: Tympanic membrane is not  perforated, not erythematous, not retracted and not bulging.  Left Ear: Tympanic membrane is not perforated, not erythematous, not retracted and not bulging.  Nose: Mucosal edema present.  Mouth/Throat: Uvula is midline and oropharynx is clear and moist. No oropharyngeal exudate or posterior oropharyngeal erythema.  Eyes: Conjunctivae are normal. Right eye exhibits no discharge. Left eye exhibits no discharge.  Cardiovascular: Normal rate and regular rhythm.  No murmur heard. Pulmonary/Chest: Effort normal and breath sounds normal. No accessory muscle usage. No respiratory distress. He has no wheezes. He has no rales.  Abdominal: Soft. He  exhibits no distension. There is no tenderness.  Lymphadenopathy:    He has no cervical adenopathy.  Neurological: He is alert.  Clear speech.   Skin: Skin is warm and dry. No rash noted.  Psychiatric: He has a normal mood and affect. His behavior is normal.  Nursing note and vitals reviewed.   ED Treatments / Results  Labs (all labs ordered are listed, but only abnormal results are displayed) Labs Reviewed - No data to display  EKG  EKG Interpretation None       Radiology Dg Chest 2 View  Result Date: 11/03/2017 CLINICAL DATA:  Flu-like symptoms, productive cough. EXAM: CHEST  2 VIEW COMPARISON:  12/07/2015. FINDINGS: Trachea is midline. Heart is enlarged. Questionable mild interstitial prominence. No airspace consolidation or pleural fluid. IMPRESSION: Questionable interstitial prominence. Difficult to exclude a viral pneumonia. Electronically Signed   By: Lorin Picket M.D.   On: 11/03/2017 11:08    Procedures Procedures (including critical care time)  Medications Ordered in ED Medications - No data to display   Initial Impression / Assessment and Plan / ED Course  I have reviewed the triage vital signs and the nursing notes.  Pertinent labs & imaging results that were available during my care of the patient were reviewed by me and considered in my medical decision making (see chart for details).  Patient presents with symptoms consistent with viral illness. He is nontoxic appearing. Patient is afebrile, no sinus tenderness, doubt sinusitis. Centor score 0, doubt strep pharyngitis. No respiratory distress, no wheezing on exam. Patient is afebrile and without adventitious sounds on lung exam, CXR negative for consolidation- however noted to have interstitial prominence, doubt bacterial pneumonia at this time. Suspect viral etiology, will treat supportively with  Flonase, and Tessalon. Prescription for Azithromycin provided- patient instructed to wait 5 days for  improvement, if no improvement instructed to start Azithromycin. I discussed results, treatment plan, need for PCP follow-up, and strict return precautions with the patient. Provided opportunity for questions, patient confirmed understanding and is in agreement with plan.    Final Clinical Impressions(s) / ED Diagnoses   Final diagnoses:  Viral illness    ED Discharge Orders        Ordered    benzonatate (TESSALON) 100 MG capsule  3 times daily PRN     11/03/17 1128    fluticasone (FLONASE) 50 MCG/ACT nasal spray  Daily     11/03/17 1128    azithromycin (ZITHROMAX) 250 MG tablet  Daily     11/03/17 [address] [place], PA-C 11/03/17 1739    Lacretia Leigh, MD 11/04/17 [phone]

## 2017-11-03 NOTE — Discharge Instructions (Signed)
You were seen in the emergency department for cough today. Your X-ray showed findings of a possible viral pneumonia.  We suspect your symptoms to be viral in nature. I have prescribed you multiple medications to treat your symptoms.   -Flonase to be used 1 spray in each nostril daily.  This medication is used to treat your congestion.  -Tessalon can be taken once every 8 hours as needed.  This medication is used to treat your cough.  -Azithromycin- this is an antibiotic.  Do not start taking this yet.  Wait 5 days to start this and if you are still not feeling well begin taking this medication as prescribed.  You will need to follow-up with your primary care provider in 2 days as scheduled  If you do not have a primary care provider one is provided in your discharge instructions.  Return to the emergency department for any new or worsening symptoms including but not limited to persistent fever, difficulty breathing, chest pain, or passing out.

## 2017-11-22 ENCOUNTER — Telehealth: Payer: Self-pay | Admitting: Oncology

## 2017-11-22 NOTE — Telephone Encounter (Signed)
R/s appt per 2/4 sch message - patient is aware of appt date and time.

## 2017-12-13 ENCOUNTER — Telehealth: Payer: Self-pay | Admitting: Oncology

## 2017-12-13 NOTE — Telephone Encounter (Signed)
Patient called to cancel °

## 2017-12-14 ENCOUNTER — Other Ambulatory Visit: Payer: Medicare Other

## 2017-12-14 ENCOUNTER — Ambulatory Visit: Payer: Self-pay | Admitting: Oncology

## 2018-03-24 ENCOUNTER — Emergency Department (HOSPITAL_COMMUNITY): Payer: Medicare Other

## 2018-03-24 ENCOUNTER — Emergency Department (HOSPITAL_COMMUNITY)
Admission: EM | Admit: 2018-03-24 | Discharge: 2018-03-25 | Disposition: A | Discharge status: Home / Self Care | Payer: Medicare Other | Attending: Emergency Medicine | Admitting: Emergency Medicine

## 2018-03-24 ENCOUNTER — Encounter (HOSPITAL_COMMUNITY): Payer: Self-pay | Admitting: Emergency Medicine

## 2018-03-24 DIAGNOSIS — M25561 Pain in right knee: Secondary | ICD-10-CM | POA: Diagnosis not present

## 2018-03-24 DIAGNOSIS — M25562 Pain in left knee: Secondary | ICD-10-CM | POA: Diagnosis not present

## 2018-03-24 DIAGNOSIS — M7989 Other specified soft tissue disorders: Secondary | ICD-10-CM | POA: Insufficient documentation

## 2018-03-24 DIAGNOSIS — I10 Essential (primary) hypertension: Secondary | ICD-10-CM | POA: Insufficient documentation

## 2018-03-24 DIAGNOSIS — Z85038 Personal history of other malignant neoplasm of large intestine: Secondary | ICD-10-CM | POA: Insufficient documentation

## 2018-03-24 NOTE — ED Triage Notes (Signed)
Patient reports chronic bilateral lower legs swelling with pain for several years worse this week , denies injury/ambulatory , no fever or chills /respirations unlabored .

## 2018-03-24 NOTE — ED Provider Notes (Signed)
Patient placed in Quick Look pathway, seen and evaluated   Chief Complaint: Knee pain and bilateral leg swelling  HPI:   Patient presents to the emergency department for evaluation of pain and swelling surrounding bilateral knees.  Patient reports for the last several years he has had chronic swelling in his legs and knees, and typically sees an orthopedist for this.  He reports history of hypertension but denies heart failure, and does not take a fluid pill regularly.  He reports late last week he fell on both knees after tripping causing worsening pain and decreased mobility over the past several days.  Patient has not been able to follow-up with his orthopedist yet regarding this.  He denies numbness or tingling, no erythema over the joints.  Patient denies any chest pain, shortness of breath.  Denies fevers or chills.  Patient reports he has been walking with a cane.  ROS: + Bilateral knee pain, leg swelling, -chest pain, shortness of breath, fevers, erythema  Physical Exam:   Gen: No distress  Neuro: Awake and Alert  Skin: Warm    Focused Exam: Bilateral lower extremities with swelling surrounding these in cast, there is no overlying erythema or warmth, patient is able to dorsi and plantarflex, but has difficulty with flexion and extension of both knees.  2+ TP pulses  Initiation of care has begun. The patient has been counseled on the process, plan, and necessity for staying for the completion/evaluation, and the remainder of the medical screening examination    Janet Berlin 03/24/18 2112    Blanchie Dessert, MD 03/26/18 2303

## 2018-03-25 DIAGNOSIS — M25561 Pain in right knee: Secondary | ICD-10-CM | POA: Diagnosis not present

## 2018-03-25 MED ORDER — HYDROCODONE-ACETAMINOPHEN 5-325 MG PO TABS
2.0000 | ORAL_TABLET | Freq: Once | ORAL | Status: AC
  Administered 2018-03-25: 2 via ORAL
  Filled 2018-03-25: qty 2

## 2018-03-25 MED ORDER — PREDNISONE 10 MG PO TABS: 20.0000 mg | ORAL_TABLET | Freq: Two times a day (BID) | ORAL | 0 refills | Status: DC

## 2018-03-25 MED ORDER — HYDROCODONE-ACETAMINOPHEN 5-325 MG PO TABS: 1.0000 | ORAL_TABLET | Freq: Four times a day (QID) | ORAL | 0 refills | Status: DC | PRN

## 2018-03-25 NOTE — ED Provider Notes (Signed)
[name] Provider Note   CSN: [id number] Arrival date & time: 03/24/18  2014     History   Chief Complaint Chief Complaint  Patient presents with  . Leg Swelling    HPI Ralph Ryan is a 62 y.o. male.  Patient is a 62 year old male with past medical history of osteoarthritis.  He presents today with complaints of knee pain and swelling.  This is been ongoing for quite some time, however is worsened over the past several weeks.  He denies any specific injury or trauma.  He denies any fevers or chills.  He does have an orthopedist that he is seen in the past.  Surgery has been discussed, however has not been scheduled.  He has also had cortisone injections in the past.      Past Medical History:  Diagnosis Date  . Abdominal pain   . Allergy   . Asthma    Patient states he does not have asthma  . Cancer ([place])    colon  . Hypertension   . Legal blindness     Patient Active Problem List   Diagnosis Date Noted  . Colon cancer ([place]) 04/16/2011  . Hypertension 04/16/2011  . Obesity 04/16/2011    Past Surgical History:  Procedure Laterality Date  . COLECTOMY  05/06/11   lap assist sigmoid colectomy  . COLON SURGERY    . FOOT SURGERY    . HAND SURGERY    . KNEE SURGERY          Home Medications    Prior to Admission medications   Medication Sig Start Date End Date Taking? Authorizing Provider  acetaminophen (TYLENOL) 500 MG tablet Take 2 tablets (1,000 mg total) by mouth every 8 (eight) hours as needed for mild pain or moderate pain. DO NOT EXCEED 4,000mg  in 24 hours 06/09/17   Muthersbaugh, Jarrett Soho, PA-C  amLODipine (NORVASC) 10 MG tablet Take 10 mg by mouth daily.    [provider]  azithromycin (ZITHROMAX) 250 MG tablet Take 1 tablet (250 mg total) by mouth daily. Take first 2 tablets together, then 1 every day until finished. 11/03/17   Petrucelli, Samantha R, PA-C  benzonatate (TESSALON) 100 MG capsule Take 1  capsule (100 mg total) by mouth 3 (three) times daily as needed for cough. 11/03/17   Petrucelli, Samantha R, PA-C  fluticasone (FLONASE) 50 MCG/ACT nasal spray Place 1 spray into both nostrils daily. 11/03/17   Petrucelli, Samantha R, PA-C  lisinopril (PRINIVIL,ZESTRIL) 10 MG tablet Take 10 mg by mouth daily. 05/18/13   Powers, Charolotte Eke, MD    Family History Family History  Problem Relation Age of Onset  . Cancer Mother     Social History Social History   Tobacco Use  . Smoking status: Never Smoker  . Smokeless tobacco: Never Used  Substance Use Topics  . Alcohol use: No  . Drug use: No     Allergies   Codeine; Penicillins; and Aspirin   Review of Systems Review of Systems  All other systems reviewed and are negative.    Physical Exam Updated Vital Signs BP 120/72   Pulse 66   Temp 97.8 F (36.6 C) (Oral)   Resp 18   SpO2 97%   Physical Exam  Constitutional: He is oriented to person, place, and time. He appears well-developed and well-nourished. No distress.  HENT:  Head: Normocephalic and atraumatic.  Neck: Normal range of motion. Neck supple.  Musculoskeletal:  The right knee  has a moderate sized effusion.  He has good range of motion with no crepitus.  The knee is stable anteriorly and posteriorly and there is no significant laxity with varus or valgus stress.  Neurological: He is alert and oriented to person, place, and time.  Skin: Skin is warm and dry. He is not diaphoretic.  Nursing note and vitals reviewed.    ED Treatments / Results  Labs (all labs ordered are listed, but only abnormal results are displayed) Labs Reviewed - No data to display  EKG None  Radiology Dg Knee Complete 4 Views Left  Result Date: 03/24/2018 CLINICAL DATA:  Acute on chronic bilateral knee pain and swelling. Fall. EXAM: LEFT KNEE - COMPLETE 4+ VIEW COMPARISON:  Radiograph 06/09/2017 FINDINGS: No evidence of fracture, dislocation, or joint effusion. Mild spurring of  the medial tibiofemoral compartment and tibial spines, stable from prior. Small quadriceps tendon enthesophyte. Soft tissues are unremarkable. Stable about the lateral knee appears external to the patient. IMPRESSION: Stable mild degenerative change without acute osseous abnormality. Electronically Signed   By: Jeb Levering M.D.   On: 03/24/2018 22:08   Dg Knee Complete 4 Views Right  Result Date: 03/24/2018 CLINICAL DATA:  Acute on chronic bilateral knee pain and swelling. Fall. EXAM: RIGHT KNEE - COMPLETE 4+ VIEW COMPARISON:  None. FINDINGS: No evidence of fracture, dislocation, or joint effusion. Tricompartmental osteoarthritis with tricompartmental spurring and prominent medial tibiofemoral joint space narrowing. Soft tissues are unremarkable. IMPRESSION: Tricompartmental osteoarthritis, medial tibiofemoral compartment predominant. No acute osseous abnormality. Electronically Signed   By: Jeb Levering M.D.   On: 03/24/2018 22:07    Procedures Procedures (including critical care time)  Medications Ordered in ED Medications  HYDROcodone-acetaminophen (NORCO/VICODIN) 5-325 MG per tablet 2 tablet (has no administration in time range)     Initial Impression / Assessment and Plan / ED Course  I have reviewed the triage vital signs and the nursing notes.  Pertinent labs & imaging results that were available during my care of the patient were reviewed by me and considered in my medical decision making (see chart for details).  Patient with an exacerbation of chronic knee pain.  He has an orthopedist he is seen in the past and I have advised him to follow-up with him to discuss his options.  In the meantime, he will be prescribed prednisone, pain medicine.  Final Clinical Impressions(s) / ED Diagnoses   Final diagnoses:  None    ED Discharge Orders    None       Veryl Speak, MD 03/25/18 0140

## 2018-03-25 NOTE — Discharge Instructions (Addendum)
Prednisone as prescribed.  Hydrocodone is prescribed as needed for pain.  Follow-up with your orthopedist in the next 1 to 2 weeks to be rechecked and discuss your situation.

## 2018-04-17 NOTE — Congregational Nurse Program (Signed)
Congregational Nurse Program Note  Date of Encounter: 04/13/2018  Past Medical History: Past Medical History:  Diagnosis Date  . Abdominal pain   . Allergy   . Asthma    Patient states he does not have asthma  . Cancer ([place])    colon  . Hypertension   . Legal blindness     Encounter Details: CNP Questionnaire - 04/13/18 1530      Questionnaire   Patient Status  Not Applicable    Race  Black or African American    Location Patient Served At  Not Applicable    Insurance  Medicaid    Uninsured  Not Applicable    Food  Yes, have food insecurities;Within past 12 months, worried food would run out with no money to buy more;Within past 12 months, food ran out with no money to buy more    Housing/Utilities  No permanent housing    Transportation  No transportation needs    Interpersonal Safety  Yes, feel physically and emotionally safe where you currently live    Medication  No medication insecurities    Medical Provider  Yes    Referrals  Primary Care Provider/Clinic    ED Visit Averted  Not Applicable    Life-Saving Intervention Made  Not Applicable     Client had questions about his medications.  He states he believes his Prednisone prescribe for arthritis pain is causing him to have agitation.  States, "it is making me feel crazy".  Client also reports he believes his Norvasc is causing his ankles and feet to swelling.  Ankles and feet are considerably swollen.  Encouraged client to contact his HCP to seek direction concerning his prednisone and Norvasc

## 2018-05-21 ENCOUNTER — Encounter (HOSPITAL_COMMUNITY): Payer: Self-pay | Admitting: Emergency Medicine

## 2018-05-21 ENCOUNTER — Emergency Department (HOSPITAL_COMMUNITY)
Admission: EM | Admit: 2018-05-21 | Discharge: 2018-05-21 | Disposition: A | Discharge status: Home / Self Care | Payer: Medicare Other | Attending: Emergency Medicine | Admitting: Emergency Medicine

## 2018-05-21 ENCOUNTER — Other Ambulatory Visit: Payer: Self-pay

## 2018-05-21 DIAGNOSIS — R350 Frequency of micturition: Secondary | ICD-10-CM

## 2018-05-21 DIAGNOSIS — I10 Essential (primary) hypertension: Secondary | ICD-10-CM | POA: Insufficient documentation

## 2018-05-21 DIAGNOSIS — R609 Edema, unspecified: Secondary | ICD-10-CM | POA: Insufficient documentation

## 2018-05-21 DIAGNOSIS — Z79899 Other long term (current) drug therapy: Secondary | ICD-10-CM | POA: Diagnosis not present

## 2018-05-21 LAB — I-STAT CHEM 8, ED
BUN: 17 mg/dL (ref 6–20)
Calcium, Ion: 1.2 mmol/L (ref 1.15–1.40)
Chloride: 102 mmol/L (ref 98–111)
Creatinine, Ser: 1.1 mg/dL (ref 0.61–1.24)
Glucose, Bld: 78 mg/dL (ref 70–99)
HEMATOCRIT: 32 % — AB (ref 39.0–52.0)
HEMOGLOBIN: 10.9 g/dL — AB (ref 13.0–17.0)
POTASSIUM: 3.9 mmol/L (ref 3.5–5.1)
Sodium: 140 mmol/L (ref 135–145)
TCO2: 26 mmol/L (ref 22–32)

## 2018-05-21 LAB — URINALYSIS, ROUTINE W REFLEX MICROSCOPIC
Bacteria, UA: NONE SEEN
Bilirubin Urine: NEGATIVE
GLUCOSE, UA: NEGATIVE mg/dL
HGB URINE DIPSTICK: NEGATIVE
Ketones, ur: 5 mg/dL — AB
LEUKOCYTES UA: NEGATIVE
Nitrite: NEGATIVE
Protein, ur: NEGATIVE mg/dL
SPECIFIC GRAVITY, URINE: 1.027 (ref 1.005–1.030)
pH: 5 (ref 5.0–8.0)

## 2018-05-21 LAB — I-STAT TROPONIN, ED: Troponin i, poc: 0 ng/mL (ref 0.00–0.08)

## 2018-05-21 NOTE — Discharge Instructions (Signed)
Please follow up with your doctor regarding your symptoms Try compression stockings to help with your leg swelling Return if you are worsening

## 2018-05-21 NOTE — ED Provider Notes (Signed)
[facility] Provider Note   CSN: [id number] Arrival date & time: 05/21/18  0919     History   Chief Complaint Chief Complaint  Patient presents with  . Urinary Frequency    HPI Ralph Ryan is a 62 y.o. male who presents with urinary frequency and incontinence. PMH significant for hx of colon cancer (in remission), blindness, HTN. He states that over the past 5 days he has had frequency and incontinence. He denies any pain associated with this. He has never seen a urologist. This has happened in the past and he reports it improved when he drank cranberry juice. He denies fever, chills, N/V, flank pain, abdominal pain, penis or testicular pain. He denies back pain or issues with his prostate. He is currently homeless.  HPI  Past Medical History:  Diagnosis Date  . Abdominal pain   . Allergy   . Asthma    Patient states he does not have asthma  . Cancer ([place])    colon  . Hypertension   . Legal blindness     Patient Active Problem List   Diagnosis Date Noted  . Colon cancer ([place]) 04/16/2011  . Hypertension 04/16/2011  . Obesity 04/16/2011    Past Surgical History:  Procedure Laterality Date  . COLECTOMY  05/06/11   lap assist sigmoid colectomy  . COLON SURGERY    . FOOT SURGERY    . HAND SURGERY    . KNEE SURGERY          Home Medications    Prior to Admission medications   Medication Sig Start Date End Date Taking? Authorizing Provider  amLODipine (NORVASC) 10 MG tablet Take 10 mg by mouth at bedtime.    Yes [provider]  naproxen sodium (ALEVE) 220 MG tablet Take 440 mg by mouth daily as needed (pain).   Yes [provider]  traMADol (ULTRAM) 50 MG tablet Take 50 mg by mouth 3 (three) times daily as needed for pain. 04/29/18  Yes [provider]  acetaminophen (TYLENOL) 500 MG tablet Take 2 tablets (1,000 mg total) by mouth every 8 (eight) hours as needed for mild pain or moderate pain. DO  NOT EXCEED 4,000mg  in 24 hours Patient not taking: Reported on 05/21/2018 06/09/17   Muthersbaugh, Jarrett Soho, PA-C  benzonatate (TESSALON) 100 MG capsule Take 1 capsule (100 mg total) by mouth 3 (three) times daily as needed for cough. Patient not taking: Reported on 05/21/2018 11/03/17   Petrucelli, Aldona Bar R, PA-C  fluticasone (FLONASE) 50 MCG/ACT nasal spray Place 1 spray into both nostrils daily. Patient not taking: Reported on 05/21/2018 11/03/17   Petrucelli, Glynda Jaeger, PA-C  HYDROcodone-acetaminophen (NORCO/VICODIN) 5-325 MG tablet Take 1-2 tablets by mouth every 6 (six) hours as needed. Patient not taking: Reported on 05/21/2018 03/25/18   Veryl Speak, MD    Family History Family History  Problem Relation Age of Onset  . Cancer Mother     Social History Social History   Tobacco Use  . Smoking status: Never Smoker  . Smokeless tobacco: Never Used  Substance Use Topics  . Alcohol use: No  . Drug use: No     Allergies   Codeine; Penicillins; and Aspirin   Review of Systems Review of Systems  Constitutional: Negative for chills and fever.  Gastrointestinal: Negative for abdominal pain, nausea and vomiting.  Genitourinary: Positive for frequency. Negative for difficulty urinating, discharge, dysuria, flank pain, genital sores, hematuria, penile pain, penile swelling, scrotal swelling and  testicular pain.  Musculoskeletal: Negative for back pain.  All other systems reviewed and are negative.    Physical Exam Updated Vital Signs BP 102/68   Pulse 61   Temp 98.1 F (36.7 C) (Oral)   Resp 16   SpO2 98%   Physical Exam  Constitutional: He is oriented to person, place, and time. He appears well-developed and well-nourished. No distress.  Obese, calm, cooperative  HENT:  Head: Normocephalic and atraumatic.  Eyes: Pupils are equal, round, and reactive to light. Conjunctivae are normal. Right eye exhibits no discharge. Left eye exhibits no discharge. No scleral icterus.  Neck:  Normal range of motion.  Cardiovascular: Normal rate and regular rhythm.  Pulmonary/Chest: Effort normal and breath sounds normal. No respiratory distress.  Abdominal: Soft. Bowel sounds are normal. He exhibits no distension. There is no tenderness.  Musculoskeletal: He exhibits edema (2+ pitting edema of the bilateral lower extremities).  Neurological: He is alert and oriented to person, place, and time.  Skin: Skin is warm and dry.  Psychiatric: He has a normal mood and affect. His behavior is normal.  Nursing note and vitals reviewed.    ED Treatments / Results  Labs (all labs ordered are listed, but only abnormal results are displayed) Labs Reviewed  URINALYSIS, ROUTINE W REFLEX MICROSCOPIC - Abnormal; Notable for the following components:      Result Value   Ketones, ur 5 (*)    All other components within normal limits  I-STAT CHEM 8, ED - Abnormal; Notable for the following components:   Hemoglobin 10.9 (*)    HCT 32.0 (*)    All other components within normal limits  I-STAT TROPONIN, ED    EKG None  Radiology No results found.  Procedures Procedures (including critical care time)  Medications Ordered in ED Medications - No data to display   Initial Impression / Assessment and Plan / ED Course  I have reviewed the triage vital signs and the nursing notes.  Pertinent labs & imaging results that were available during my care of the patient were reviewed by me and considered in my medical decision making (see chart for details).  76-year-old male presents with urinary frequency and dribbling for the past week.  Unclear etiology although he has had presentations for this in the past which have self resolved. He has bradycardia at times but otherwise vital signs are normal.  His abdomen is soft and nontender.  UA is not consistent with infection.  Chem-8 shows normal renal function.  Bladder scan showed 50 cc of urine.  Discussed wearing compression stockings for his  peripheral edema and he verbalized understanding.  He states he has a follow-up appointment with his PCP on August 11.  Return precautions were given.  Final Clinical Impressions(s) / ED Diagnoses   Final diagnoses:  Urinary frequency  Peripheral edema    ED Discharge Orders    None       Recardo Evangelist, PA-C 05/21/18 1547    Pattricia Boss, MD 05/21/18 [phone]

## 2018-05-21 NOTE — ED Triage Notes (Signed)
Pt to ER for evaluation of urinary frequency x1 week. States saw PCP without recommendations.

## 2018-05-26 ENCOUNTER — Ambulatory Visit (HOSPITAL_COMMUNITY)
Admission: RE | Admit: 2018-05-26 | Discharge: 2018-05-26 | Disposition: A | Discharge status: Home / Self Care | Payer: Medicare Other | Source: Ambulatory Visit | Attending: Orthopedic Surgery | Admitting: Orthopedic Surgery

## 2018-05-26 ENCOUNTER — Other Ambulatory Visit (HOSPITAL_COMMUNITY): Payer: Self-pay | Admitting: Orthopedic Surgery

## 2018-05-26 DIAGNOSIS — M79604 Pain in right leg: Secondary | ICD-10-CM | POA: Diagnosis not present

## 2018-05-26 DIAGNOSIS — M7989 Other specified soft tissue disorders: Principal | ICD-10-CM

## 2018-05-26 NOTE — Progress Notes (Addendum)
RLE venous duplex prelim: no obvious evidence of DVT. Landry Mellow, RDMS, RVT Darlina Sicilian, RDCS   Left voicemail for Josh with results.

## 2018-05-27 ENCOUNTER — Encounter (HOSPITAL_COMMUNITY): Payer: Self-pay | Admitting: Emergency Medicine

## 2018-05-27 ENCOUNTER — Emergency Department (HOSPITAL_COMMUNITY)
Admission: EM | Admit: 2018-05-27 | Discharge: 2018-05-27 | Disposition: A | Discharge status: Home / Self Care | Payer: Medicare Other | Attending: Emergency Medicine | Admitting: Emergency Medicine

## 2018-05-27 DIAGNOSIS — J45909 Unspecified asthma, uncomplicated: Secondary | ICD-10-CM | POA: Diagnosis not present

## 2018-05-27 DIAGNOSIS — Z85038 Personal history of other malignant neoplasm of large intestine: Secondary | ICD-10-CM | POA: Insufficient documentation

## 2018-05-27 DIAGNOSIS — I10 Essential (primary) hypertension: Secondary | ICD-10-CM | POA: Diagnosis present

## 2018-05-27 DIAGNOSIS — R030 Elevated blood-pressure reading, without diagnosis of hypertension: Secondary | ICD-10-CM

## 2018-05-27 NOTE — ED Provider Notes (Signed)
[facility] DEPART[facility] Provider Note   CSN: [id number] Arrival date & time: 05/27/18  0217     History   Chief Complaint Chief Complaint  Patient presents with  . Hypertension    HPI Ralph Ryan is a 62 y.o. male.  HPI The patient is a 62 year old male who presents to the emergency department with complaints of elevated blood pressure.  [facility] at the shelter.  He felt as though his blood pressure is elevated and thus he came to the ER for evaluation.  No chest pain or shortness of breath.  No headache.  No dizziness.  No weakness of his arms or legs.  No other complaints.  He was previously on amlodipine.  He states that his physician is changing him to a different blood pressure medication.  He does not know what the new blood pressure medication will be.  He is scheduled to see his primary care physician in 4 days.    Past Medical History:  Diagnosis Date  . Abdominal pain   . Allergy   . Asthma    Patient states he does not have asthma  . Cancer ([place])    colon  . Hypertension   . Legal blindness     Patient Active Problem List   Diagnosis Date Noted  . Colon cancer ([place]) 04/16/2011  . Hypertension 04/16/2011  . Obesity 04/16/2011    Past Surgical History:  Procedure Laterality Date  . COLECTOMY  05/06/11   lap assist sigmoid colectomy  . COLON SURGERY    . FOOT SURGERY    . HAND SURGERY    . KNEE SURGERY          Home Medications    Prior to Admission medications   Medication Sig Start Date End Date Taking? Authorizing Provider  amLODipine (NORVASC) 10 MG tablet Take 10 mg by mouth at bedtime.     [provider]  naproxen sodium (ALEVE) 220 MG tablet Take 440 mg by mouth daily as needed (pain).    [provider]  traMADol (ULTRAM) 50 MG tablet Take 50 mg by mouth 3 (three) times daily as needed for pain. 04/29/18   [provider]    Family History Family History  Problem  Relation Age of Onset  . Cancer Mother     Social History Social History   Tobacco Use  . Smoking status: Never Smoker  . Smokeless tobacco: Never Used  Substance Use Topics  . Alcohol use: No  . Drug use: No     Allergies   Codeine; Penicillins; and Aspirin   Review of Systems Review of Systems  All other systems reviewed and are negative.    Physical Exam Updated Vital Signs BP (!) 153/98 (BP Location: Right Wrist)   Pulse 84   Temp 97.8 F (36.6 C) (Oral)   Resp 20   Ht 5\' 10"  (1.778 m)   Wt 117.9 kg   SpO2 100%   BMI 37.31 kg/m   Physical Exam  Constitutional: He is oriented to person, place, and time. He appears well-developed and well-nourished.  HENT:  Head: Normocephalic and atraumatic.  Eyes: EOM are normal.  Neck: Normal range of motion.  Cardiovascular: Normal rate, regular rhythm, normal heart sounds and intact distal pulses.  Pulmonary/Chest: Effort normal and breath sounds normal. No respiratory distress.  Abdominal: Soft. He exhibits no distension. There is no tenderness.  Musculoskeletal: Normal range of motion.  Neurological: He is alert  and oriented to person, place, and time.  Skin: Skin is warm and dry.  Psychiatric: He has a normal mood and affect. Judgment normal.  Nursing note and vitals reviewed.    ED Treatments / Results  Labs (all labs ordered are listed, but only abnormal results are displayed) Labs Reviewed - No data to display  EKG None  Radiology No results found.  Procedures Procedures (including critical care time)  Medications Ordered in ED Medications - No data to display   Initial Impression / Assessment and Plan / ED Course  I have reviewed the triage vital signs and the nursing notes.  Pertinent labs & imaging results that were available during my care of the patient were reviewed by me and considered in my medical decision making (see chart for details).     Asymptomatic hypertension.  Follow-up  with his primary care physician in 4 days.  Will allow his primary care physician to start the new medication.  No indication for additional evaluation or work-up at this time.  Final Clinical Impressions(s) / ED Diagnoses   Final diagnoses:  Elevated blood pressure reading    ED Discharge Orders    None       Jola Schmidt, MD 05/27/18 [phone]

## 2018-05-27 NOTE — ED Triage Notes (Signed)
Patient requesting his medications for hypertension and hypercholesterolemia , pt. stated he has not taken these medications for several weeks , denies pain / respirations unlabored.

## 2018-08-15 ENCOUNTER — Telehealth: Payer: Self-pay | Admitting: Oncology

## 2018-08-15 ENCOUNTER — Telehealth: Payer: Self-pay | Admitting: Nurse Practitioner

## 2018-08-15 ENCOUNTER — Telehealth: Payer: Self-pay | Admitting: *Deleted

## 2018-08-15 ENCOUNTER — Other Ambulatory Visit: Payer: Self-pay | Admitting: *Deleted

## 2018-08-15 DIAGNOSIS — Z85038 Personal history of other malignant neoplasm of large intestine: Secondary | ICD-10-CM

## 2018-08-15 NOTE — Telephone Encounter (Signed)
R/s appt per 10/28 sch message - left message for patient with appt date and time

## 2018-08-15 NOTE — Telephone Encounter (Signed)
TC from patient this am. He states he is having occasional  "colon pain".  It comes and goes, he thinks it might be gas. Advised pt to obtain some Gas-X/simethicone OTC to see if that helps. Pt states he will try the Gas-X.  Pt states he is not having any problem with bowel movements/nausea or vomiting.  He does state he would like to make an appt with Dr. Benay Spice or Ned Card, NP to follow up on his pain and his general health status related to his h/o colon cancer. His last visit here was November 2017.  Scheduling message sent for MD appt as well as lab appt in the next 1-2 weeks. Lab orders entered as well.

## 2018-08-15 NOTE — Telephone Encounter (Signed)
Scheduled appt per 10/28 sch message - pt is aware of appt date and time.   

## 2018-08-22 ENCOUNTER — Telehealth: Payer: Self-pay | Admitting: Nurse Practitioner

## 2018-08-22 ENCOUNTER — Telehealth: Payer: Self-pay | Admitting: *Deleted

## 2018-08-22 NOTE — Telephone Encounter (Signed)
Patient called to request moving appointment from 11/8 to 11/14 due to wife working. Scheduling message sent per in-basket.

## 2018-08-22 NOTE — Telephone Encounter (Signed)
R/s appt per 11/4 sch message - left message for patient with appt date and time.   

## 2018-08-26 ENCOUNTER — Ambulatory Visit: Payer: Medicare Other | Admitting: Nurse Practitioner

## 2018-08-26 ENCOUNTER — Other Ambulatory Visit: Payer: Medicare Other

## 2018-08-29 ENCOUNTER — Telehealth: Payer: Self-pay | Admitting: Nurse Practitioner

## 2018-08-29 NOTE — Telephone Encounter (Signed)
Spoke with patient regarding a voice mail message that was left.  Patient wanted to reschedule to December.

## 2018-09-02 ENCOUNTER — Other Ambulatory Visit: Payer: Medicare Other

## 2018-09-02 ENCOUNTER — Telehealth: Payer: Self-pay | Admitting: *Deleted

## 2018-09-02 ENCOUNTER — Ambulatory Visit: Payer: Medicare Other | Admitting: Nurse Practitioner

## 2018-09-02 NOTE — Telephone Encounter (Signed)
Called to request moving his December appointment to another day. Message sent to scheduler.

## 2018-09-27 ENCOUNTER — Ambulatory Visit: Payer: Medicare Other | Admitting: Nurse Practitioner

## 2018-09-27 ENCOUNTER — Other Ambulatory Visit: Payer: Medicare Other

## 2018-09-29 ENCOUNTER — Inpatient Hospital Stay (HOSPITAL_BASED_OUTPATIENT_CLINIC_OR_DEPARTMENT_OTHER): Payer: Medicare Other | Admitting: Nurse Practitioner

## 2018-09-29 ENCOUNTER — Telehealth: Payer: Self-pay | Admitting: Nurse Practitioner

## 2018-09-29 ENCOUNTER — Telehealth: Payer: Self-pay

## 2018-09-29 ENCOUNTER — Inpatient Hospital Stay: Payer: Medicare Other | Attending: Oncology

## 2018-09-29 ENCOUNTER — Encounter: Payer: Self-pay | Admitting: Nurse Practitioner

## 2018-09-29 VITALS — BP 133/74 | HR 66 | Temp 97.6°F | Resp 17 | Ht 70.0 in | Wt 306.7 lb

## 2018-09-29 DIAGNOSIS — Z85038 Personal history of other malignant neoplasm of large intestine: Secondary | ICD-10-CM

## 2018-09-29 LAB — CBC WITH DIFFERENTIAL (CANCER CENTER ONLY)
Abs Immature Granulocytes: 0.01 10*3/uL (ref 0.00–0.07)
Basophils Absolute: 0 10*3/uL (ref 0.0–0.1)
Basophils Relative: 0 %
Eosinophils Absolute: 0.1 10*3/uL (ref 0.0–0.5)
Eosinophils Relative: 1 %
HEMATOCRIT: 38.2 % — AB (ref 39.0–52.0)
HEMOGLOBIN: 11.7 g/dL — AB (ref 13.0–17.0)
Immature Granulocytes: 0 %
LYMPHS ABS: 2 10*3/uL (ref 0.7–4.0)
Lymphocytes Relative: 43 %
MCH: 27.7 pg (ref 26.0–34.0)
MCHC: 30.6 g/dL (ref 30.0–36.0)
MCV: 90.5 fL (ref 80.0–100.0)
MONO ABS: 0.4 10*3/uL (ref 0.1–1.0)
Monocytes Relative: 9 %
NEUTROS ABS: 2.1 10*3/uL (ref 1.7–7.7)
NEUTROS PCT: 47 %
Platelet Count: 190 10*3/uL (ref 150–400)
RBC: 4.22 MIL/uL (ref 4.22–5.81)
RDW: 13.5 % (ref 11.5–15.5)
WBC: 4.6 10*3/uL (ref 4.0–10.5)
nRBC: 0 % (ref 0.0–0.2)

## 2018-09-29 LAB — CMP (CANCER CENTER ONLY)
ALBUMIN: 3.6 g/dL (ref 3.5–5.0)
ALT: 11 U/L (ref 0–44)
ANION GAP: 7 (ref 5–15)
AST: 18 U/L (ref 15–41)
Alkaline Phosphatase: 63 U/L (ref 38–126)
BUN: 16 mg/dL (ref 8–23)
CO2: 30 mmol/L (ref 22–32)
Calcium: 9.3 mg/dL (ref 8.9–10.3)
Chloride: 103 mmol/L (ref 98–111)
Creatinine: 1.05 mg/dL (ref 0.61–1.24)
GFR, Estimated: >60 mL/min (ref >60)
Glucose, Bld: 109 mg/dL — ABNORMAL HIGH (ref 70–99)
Potassium: 3.9 mmol/L (ref 3.5–5.1)
Sodium: 140 mmol/L (ref 135–145)
TOTAL PROTEIN: 7.8 g/dL (ref 6.5–8.1)
Total Bilirubin: 0.5 mg/dL (ref 0.3–1.2)

## 2018-09-29 LAB — CEA (IN HOUSE-CHCC): CEA (CHCC-In House): 1.99 ng/mL (ref 0.00–5.00)

## 2018-09-29 NOTE — Telephone Encounter (Signed)
Printed calendar and avs. °

## 2018-09-29 NOTE — Telephone Encounter (Signed)
TC to Pt left message to return call to [facility]

## 2018-09-29 NOTE — Progress Notes (Signed)
  [facility] OFFICE PROGRESS NOTE   Diagnosis: Colon cancer  INTERVAL HISTORY:   Ralph Ryan was last seen at the [facility] approximately 2 years ago.  No change in bowel habits.  No bleeding.  He has occasional low abdominal pain.  He thinks this is likely "gas".  He describes his appetite as "fair".  Objective:  Vital signs in last 24 hours:  Blood pressure 133/74, pulse 66, temperature 97.6 F (36.4 C), temperature source Oral, resp. rate 17, height 5\' 10"  (1.778 m), weight (!) 306 lb 11.2 oz (139.1 kg), SpO2 99 %.    HEENT: Neck without mass. Lymphatics: No palpable cervical, supraclavicular, axillary or inguinal lymph nodes. Resp: Lungs clear bilaterally. Cardio: Regular rate and rhythm. GI: Abdomen soft and nontender.  No hepatomegaly. Vascular: No leg edema.   Lab Results:  Lab Results  Component Value Date   WBC 4.6 09/29/2018   HGB 11.7 (L) 09/29/2018   HCT 38.2 (L) 09/29/2018   MCV 90.5 09/29/2018   PLT 190 09/29/2018   NEUTROABS 2.1 09/29/2018    Imaging:  No results found.  Medications: I have reviewed the patient's current medications.  Assessment/Plan: 1. 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 He began adjuvant Xeloda chemotherapy 06/10/2011. He completed the eighth and final cycle of adjuvant Xeloda chemotherapy beginning on 11/12/2011. -Surveillance colonoscopy August 2013 with removal of tubular adenomas and a hyperplastic polyp.  -Colonoscopy 06/13/2015-two 1-2 mm polyps in the rectum and in the ascending colon; repeat colonoscopy in 5 years for surveillance 2. History of hand-foot syndrome secondary to Xeloda, improved with a dose reduction beginning with cycle #3. 3. Hypertension. 4. Asthma. 5. Legal blindness.  Disposition: Ralph Ryan remains in clinical remission from colon cancer.  We will follow-up on the CEA from today.  He is now  approximately 7-1/2 years out from diagnosis.  He would like [facility].  He will return for a CEA and follow-up visit in 1 year.  He will continue surveillance colonoscopy follow-up with Dr. Benson Norway.  Plan reviewed with Dr. Benay Spice.    Ned Card ANP/GNP-BC   09/29/2018  11:29 AM

## 2018-09-30 ENCOUNTER — Telehealth: Payer: Self-pay | Admitting: *Deleted

## 2018-09-30 NOTE — Telephone Encounter (Signed)
TCT patient regarding recent lab results. No answer to call but was able to leave VM message to call back at his convenience regarding CEA. Result is normal. His next appt is Dec. 2020

## 2018-09-30 NOTE — Telephone Encounter (Signed)
-----   Message from Owens Shark, NP sent at 09/29/2018  3:03 PM EST ----- Please let him know the CEA is normal.  Follow-up as scheduled.

## 2018-10-05 ENCOUNTER — Telehealth: Payer: Self-pay | Admitting: *Deleted

## 2018-10-05 NOTE — Telephone Encounter (Signed)
Swade T Ciaravino returned call.  Provided CEA information.

## 2018-10-05 NOTE — Telephone Encounter (Signed)
"  Ralph Ryan calling for my lab resutts.  No one has called yet.  Did they find any new cancer cells?  I'll be seen September 28, 2019 right?"  Provided CEA results as normal as noted in today's previous call documentation.  Next scheduled F/U is 10-02-2019 at 12:00 pm.  Asked to arrive thirty minutes early for registation process.  Denies further questions or needs at this time.

## 2019-09-29 ENCOUNTER — Telehealth: Payer: Self-pay | Admitting: Oncology

## 2019-09-29 NOTE — Telephone Encounter (Signed)
Called patient per providers request, voicemail not set-up.

## 2019-10-02 ENCOUNTER — Inpatient Hospital Stay: Payer: Medicare Other | Admitting: Oncology

## 2019-10-02 ENCOUNTER — Inpatient Hospital Stay: Payer: Medicare Other | Attending: Oncology

## 2019-10-04 ENCOUNTER — Encounter: Payer: Self-pay | Admitting: *Deleted

## 2019-10-04 NOTE — Progress Notes (Signed)
[facility] 10/02/2019. Scheduling message sent to reschedule for Jan or Feb--OK for telephone visit w/MD if patient prefers.

## 2019-10-05 ENCOUNTER — Telehealth: Payer: Self-pay | Admitting: Oncology

## 2019-10-05 NOTE — Telephone Encounter (Signed)
Scheduled appt per 12/16 sch message - unable to reach pt and unable to leave vmail. Mailed reminder letter with appt date and time

## 2019-11-10 ENCOUNTER — Other Ambulatory Visit: Payer: Self-pay | Admitting: *Deleted

## 2019-11-10 DIAGNOSIS — Z85038 Personal history of other malignant neoplasm of large intestine: Secondary | ICD-10-CM

## 2019-11-13 ENCOUNTER — Other Ambulatory Visit: Payer: Self-pay

## 2019-11-13 ENCOUNTER — Inpatient Hospital Stay: Payer: Medicare Other

## 2019-11-13 ENCOUNTER — Inpatient Hospital Stay: Payer: Medicare Other | Attending: Oncology | Admitting: Oncology

## 2019-11-13 VITALS — BP 141/88 | HR 90 | Resp 18 | Ht 70.0 in | Wt 353.4 lb

## 2019-11-13 DIAGNOSIS — Z85038 Personal history of other malignant neoplasm of large intestine: Secondary | ICD-10-CM | POA: Diagnosis present

## 2019-11-13 LAB — CEA (IN HOUSE-CHCC): CEA (CHCC-In House): 1.89 ng/mL (ref 0.00–5.00)

## 2019-11-13 NOTE — Progress Notes (Signed)
  [facility] OFFICE VISIT PROGRESS NOTE  I connected with@ on 11/13/19 at 12:30 PM EST by video and verified that I am speaking with the correct person using two identifiers.   I discussed the limitations, risks, security and privacy concerns of performing an evaluation and management service by telemedicine and the availability of in-person appointments. I also discussed with the patient that there may be a patient responsible charge related to this service. The patient expressed understanding and agreed to proceed.    Patient's location: Office Provider's location: Home    Diagnosis: Colon cancer  INTERVAL HISTORY:   Mr. Quam returns for a scheduled visit.  He reports feeling well.  He has chronic pain in the right knee and leg.  He reports the right leg has been swollen for years.  No new complaint.  He is scheduled to have a colonoscopy with Dr. Benson Norway this year.  Objective:  Vital signs in last 24 hours:  Blood pressure (!) 141/88, pulse 90, resp. rate 18, height 5\' 10"  (1.778 m), weight (!) 353 lb 6.4 oz (160.3 kg), SpO2 100 %.     Lab Results:  Lab Results  Component Value Date   WBC 4.6 09/29/2018   HGB 11.7 (L) 09/29/2018   HCT 38.2 (L) 09/29/2018   MCV 90.5 09/29/2018   PLT 190 09/29/2018   NEUTROABS 2.1 09/29/2018   Medications: I have reviewed the patient's current medications.  Assessment/Plan: 1. 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 He began adjuvant Xeloda chemotherapy 06/10/2011. He completed the eighth and final cycle of adjuvant Xeloda chemotherapy beginning on 11/12/2011. -Surveillance colonoscopy August 2013 with removal of tubular adenomas and a hyperplastic polyp. -Colonoscopy 06/13/2015-two1-2 mm polyps in the rectum and in the ascending colon;repeat colonoscopy in 5 years for surveillance 2. History of hand-foot  syndrome secondary to Xeloda, improved with a dose reduction beginning with cycle #3. 3. Hypertension. 4. Asthma. 5. Legal blindness.   Disposition: Mr. Mitcheltree is in clinical remission from colon cancer.  We will follow up on the CEA from today.  He has a good prognosis for a long-term disease-free survival.  He will schedule a colonoscopy with Dr. Benson Norway this year.  Mr. Siddoway would like to continue follow-up at the cancer center.  He will return for an office visit in 1 year. We discussed diet and exercise maneuvers that may decrease the risk of developing polyps.  He plans to begin an exercise program.    I discussed the assessment and treatment plan with the patient. The patient was provided an opportunity to ask questions and all were answered. The patient agreed with the plan and demonstrated an understanding of the instructions.   The patient was advised to call back or seek an in-person evaluation if the symptoms worsen or if the condition fails to improve as anticipated.  Betsy Coder ANP/GNP-BC   11/13/2019 1:01 PM

## 2019-11-14 ENCOUNTER — Telehealth: Payer: Self-pay | Admitting: *Deleted

## 2019-11-14 NOTE — Telephone Encounter (Signed)
-----   Message from Ladell Pier, MD sent at 11/13/2019  1:56 PM EST ----- Please call patient, Ralph Ryan is normal

## 2019-11-14 NOTE — Telephone Encounter (Signed)
Notified patient of normal CEA and to follow up as scheduled in 1 year. He is requesting our office reach out to Dr. Benson Norway for him since he is due his 5 year colonoscopy in 2021. Faxed office note and reminder to Dr. Benson Norway.

## 2019-12-19 ENCOUNTER — Encounter: Payer: Self-pay | Admitting: *Deleted

## 2019-12-19 NOTE — Progress Notes (Signed)
Return fax from Dr. Benson Norway that they have attempted to contact patient x 2 (11/14/18 & 12/19/19) with no response from patient.

## 2019-12-21 ENCOUNTER — Telehealth: Payer: Self-pay | Admitting: *Deleted

## 2019-12-21 NOTE — Telephone Encounter (Signed)
Called to report GI called him for colonoscopy and told him insurance won't cover it and he has to pay $300. Informed him that this could be he copay. Suggested he call GI office back and ask to speak w/financial office to explain and confirm they have all his insurance information. They may be able to set up a payment plan as well. Also asked RN what to take for "gas". Suggested Gas-X or mylicon.

## 2020-11-12 ENCOUNTER — Inpatient Hospital Stay (HOSPITAL_BASED_OUTPATIENT_CLINIC_OR_DEPARTMENT_OTHER): Payer: Medicare Other | Admitting: Nurse Practitioner

## 2020-11-12 ENCOUNTER — Inpatient Hospital Stay: Payer: Medicare Other | Attending: Oncology

## 2020-11-12 ENCOUNTER — Other Ambulatory Visit: Payer: Medicare Other

## 2020-11-12 ENCOUNTER — Ambulatory Visit: Payer: Medicare Other | Admitting: Nurse Practitioner

## 2020-11-12 ENCOUNTER — Other Ambulatory Visit: Payer: Self-pay

## 2020-11-12 ENCOUNTER — Encounter: Payer: Self-pay | Admitting: Nurse Practitioner

## 2020-11-12 VITALS — BP 161/78 | HR 91 | Temp 98.0°F | Resp 19 | Ht 70.0 in | Wt 345.7 lb

## 2020-11-12 DIAGNOSIS — H548 Legal blindness, as defined in USA: Secondary | ICD-10-CM | POA: Diagnosis not present

## 2020-11-12 DIAGNOSIS — I10 Essential (primary) hypertension: Secondary | ICD-10-CM | POA: Insufficient documentation

## 2020-11-12 DIAGNOSIS — Z85038 Personal history of other malignant neoplasm of large intestine: Secondary | ICD-10-CM

## 2020-11-12 DIAGNOSIS — Z23 Encounter for immunization: Secondary | ICD-10-CM | POA: Insufficient documentation

## 2020-11-12 DIAGNOSIS — Z9221 Personal history of antineoplastic chemotherapy: Secondary | ICD-10-CM | POA: Diagnosis not present

## 2020-11-12 DIAGNOSIS — Z9049 Acquired absence of other specified parts of digestive tract: Secondary | ICD-10-CM | POA: Diagnosis not present

## 2020-11-12 DIAGNOSIS — J45909 Unspecified asthma, uncomplicated: Secondary | ICD-10-CM | POA: Insufficient documentation

## 2020-11-12 LAB — CEA (IN HOUSE-CHCC): CEA (CHCC-In House): 1.82 ng/mL (ref 0.00–5.00)

## 2020-11-12 MED ORDER — INFLUENZA VAC SPLIT QUAD 0.5 ML IM SUSY
PREFILLED_SYRINGE | INTRAMUSCULAR | Status: AC
  Filled 2020-11-12: qty 0.5

## 2020-11-12 MED ORDER — INFLUENZA VAC SPLIT QUAD 0.5 ML IM SUSY
0.5000 mL | PREFILLED_SYRINGE | Freq: Once | INTRAMUSCULAR | Status: AC
  Administered 2020-11-12: 0.5 mL via INTRAMUSCULAR

## 2020-11-12 NOTE — Progress Notes (Signed)
  [facility] OFFICE PROGRESS NOTE   Diagnosis: Colon cancer  INTERVAL HISTORY:   Mr. Ferrando returns as scheduled.  No change in bowel habits.  No bleeding with bowel movements.  Occasional "gas" related abdominal discomfort.  He has a good appetite.  He is exercising on a daily basis and watching his diet in an effort to lose weight.  He reports a colonoscopy over the summer 2021 with 2 polyps removed.  Objective:  Vital signs in last 24 hours:  Blood pressure (!) 150/99, pulse 92, temperature 98 F (36.7 C), temperature source Tympanic, resp. rate 19, height 5\' 10"  (1.778 m), weight (!) 345 lb 11.2 oz (156.8 kg), SpO2 100 %.    HEENT: Neck without mass. Lymphatics: No palpable cervical, supraclavicular, axillary or inguinal lymph nodes. Resp: Lungs clear bilaterally. Cardio: Regular rate and rhythm. GI: Abdomen soft and nontender.  No hepatomegaly. Vascular: No leg edema.  Lab Results:  Lab Results  Component Value Date   WBC 4.6 09/29/2018   HGB 11.7 (L) 09/29/2018   HCT 38.2 (L) 09/29/2018   MCV 90.5 09/29/2018   PLT 190 09/29/2018   NEUTROABS 2.1 09/29/2018    Imaging:  No results found.  Medications: I have reviewed the patient's current medications.  Assessment/Plan: 1. 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 He began adjuvant Xeloda chemotherapy 06/10/2011. He completed the eighth and final cycle of adjuvant Xeloda chemotherapy beginning on 11/12/2011. -Surveillance colonoscopy August 2013 with removal of tubular adenomas and a hyperplastic polyp. -Colonoscopy 06/13/2015-two1-2 mm polyps in the rectum and in the ascending colon;repeat colonoscopy in 5 years for surveillance 2. History of hand-foot syndrome secondary to Xeloda, improved with a dose reduction beginning with cycle #3. 3. Hypertension. 4. Asthma. 5. Legal blindness.   Disposition: Mr. Odwyer  remains in clinical remission from colon cancer.  We will follow-up on the CEA from today.  He will receive the influenza vaccine today.  He will return for a Covid booster in 2 weeks.  He will return for CEA and follow-up visit in 6 months.    Ned Card ANP/GNP-BC   11/12/2020  12:48 PM

## 2020-11-13 ENCOUNTER — Telehealth: Payer: Self-pay | Admitting: Nurse Practitioner

## 2020-11-13 ENCOUNTER — Telehealth: Payer: Self-pay | Admitting: Oncology

## 2020-11-13 NOTE — Telephone Encounter (Signed)
Scheduled appointments per 1/25 los. Called patient, no answer. Left message with appointments dates and times.

## 2020-11-13 NOTE — Telephone Encounter (Signed)
Returned call to reschedule upcoming appointment per 1/26 schedule message. Left message to call back to reschedule.

## 2020-11-14 ENCOUNTER — Telehealth: Payer: Self-pay | Admitting: *Deleted

## 2020-11-14 NOTE — Telephone Encounter (Signed)
Pt left vm stating refill was needed on med. Called pt and left vm to call office back with concerns about refills. Awaiting callback.

## 2020-11-26 ENCOUNTER — Ambulatory Visit: Payer: Medicare Other

## 2020-11-28 ENCOUNTER — Telehealth: Payer: Self-pay | Admitting: Oncology

## 2020-11-28 NOTE — Telephone Encounter (Signed)
Called pt per 2/9 sch msg- no answer. Left message for patient to call back to reschedule appt.

## 2020-11-29 ENCOUNTER — Inpatient Hospital Stay: Payer: Medicare Other | Attending: Nurse Practitioner

## 2020-12-04 ENCOUNTER — Telehealth: Payer: Self-pay | Admitting: Oncology

## 2020-12-04 NOTE — Telephone Encounter (Signed)
R/s appointment times per provider on call schedule. Mailed updated calendar to patient with appointments updated times.

## 2021-05-13 ENCOUNTER — Ambulatory Visit: Payer: Medicare Other | Admitting: Oncology

## 2021-05-13 ENCOUNTER — Encounter: Payer: Self-pay | Admitting: General Practice

## 2021-05-13 ENCOUNTER — Other Ambulatory Visit: Payer: Self-pay | Admitting: *Deleted

## 2021-05-13 ENCOUNTER — Inpatient Hospital Stay: Payer: Medicare Other

## 2021-05-13 ENCOUNTER — Inpatient Hospital Stay: Payer: Medicare Other | Admitting: Oncology

## 2021-05-13 ENCOUNTER — Other Ambulatory Visit: Payer: Medicare Other

## 2021-05-13 DIAGNOSIS — Z85038 Personal history of other malignant neoplasm of large intestine: Secondary | ICD-10-CM

## 2021-05-13 NOTE — Progress Notes (Signed)
[facility] CSW Progress Notes  Request from medical oncology office to reach out to patient - he reports that he will soon be homeless, cancelled his upcoming medical appointments as a result.  Called patient.  He is living in an apartment that is affordable at present - rent is being increased such that he will not be able to afford the new rent.  He is on waiting lists for several subsidized housing options as well as on the [place] 8 wait list.  Advised him to go to [employer] and obtain a list of boarding houses and similar - he has used their services before and is familiar with them.  Also provided him w contact information for [facility], transitional housing for people with limited income.  He also states that he "forgot" about his appointment today and has rescheduled.  He denies any issues w transportation.  He will contact CSW w any further concerns, please reconsult if needed.  Edwyna Shell, LCSW Clinical Social Worker Phone:  [phone]

## 2021-05-13 NOTE — Progress Notes (Signed)
Patient reports he is soon to be homeless. Can no longer afford rent. Patient is also legally blind. Placed referral for CSW.

## 2021-05-20 ENCOUNTER — Inpatient Hospital Stay: Payer: Medicare Other

## 2021-05-20 ENCOUNTER — Inpatient Hospital Stay: Payer: Medicare Other | Admitting: Oncology

## 2021-06-20 ENCOUNTER — Inpatient Hospital Stay: Payer: Medicare Other | Admitting: Oncology

## 2021-06-20 ENCOUNTER — Inpatient Hospital Stay: Payer: Medicare Other

## 2021-07-10 ENCOUNTER — Inpatient Hospital Stay: Payer: Medicare Other | Admitting: Oncology

## 2021-07-10 ENCOUNTER — Inpatient Hospital Stay: Payer: Medicare Other | Attending: Oncology

## 2022-04-10 ENCOUNTER — Inpatient Hospital Stay: Payer: Medicare Other | Attending: Oncology | Admitting: Oncology

## 2022-06-19 ENCOUNTER — Ambulatory Visit: Payer: Medicare Other | Admitting: Dietician

## 2022-06-25 ENCOUNTER — Ambulatory Visit: Payer: Medicare Other | Admitting: Dietician

## 2022-07-09 ENCOUNTER — Ambulatory Visit: Payer: Medicare Other | Admitting: Dietician

## 2022-07-13 ENCOUNTER — Ambulatory Visit: Payer: Medicare Other | Admitting: Dietician

## 2022-08-07 ENCOUNTER — Encounter: Payer: Medicare HMO | Attending: Nurse Practitioner | Admitting: Dietician

## 2022-08-07 ENCOUNTER — Encounter: Payer: Self-pay | Admitting: Dietician

## 2022-08-07 DIAGNOSIS — I10 Essential (primary) hypertension: Secondary | ICD-10-CM | POA: Insufficient documentation

## 2022-08-07 DIAGNOSIS — Z6841 Body Mass Index (BMI) 40.0 and over, adult: Secondary | ICD-10-CM | POA: Diagnosis not present

## 2022-08-07 DIAGNOSIS — Z713 Dietary counseling and surveillance: Secondary | ICD-10-CM | POA: Diagnosis not present

## 2022-08-07 DIAGNOSIS — J45909 Unspecified asthma, uncomplicated: Secondary | ICD-10-CM | POA: Insufficient documentation

## 2022-08-07 DIAGNOSIS — E119 Type 2 diabetes mellitus without complications: Secondary | ICD-10-CM | POA: Insufficient documentation

## 2022-08-07 NOTE — Patient Instructions (Addendum)
Aim for 150 minutes of physical activity weekly. Make physical activity a part of your week. Try to include at least 30 minutes of physical activity 5 days each week or at least 150 minutes per week. Regular physical activity promotes overall health-including helping to reduce risk for heart disease and diabetes, promoting mental health, and helping [place] sleep better.     Consider having an eye and dental exam for preventative measures- pg 34 of diabetes book.   Eat more Non-Starchy Vegetables: Aim to make 1/2 your plate vegetables. -If having a healthy choice dinner make some frozen veggies or salad to have with it.  Balance your plate this way: 1/2 plate of non-starchy vegetables 1/4 plate protein (meat or non-meat) 1/4 plate carbohydrate (fruits, grains, starchy vegetables)  Minimize added sugars and refined grains. Choose whole grains and whole wheat.   Make simple meals at home more often than eating out.  When snacking choose Carbohydrate + Protein -grapes + cheese -fruit + nuts  Breakfast: (carb+protein) - grits + eggs - oatmeal + nuts or boiled egg - cheerios + nuts or boiled egg - special K bar + greek yogurt  Avoid skipping meals. At least have a snack at lunch time instead of nothing. Choose a carbohydrate and protein from balanced snack sheet.

## 2022-08-07 NOTE — Progress Notes (Signed)
Diabetes Self-Management Education  Visit Type: First/Initial  Appt. Start Time: 0750 Appt. End Time: 0845  08/07/2022  [name], identified by name and date of birth, is a 66 y.o. male with a diagnosis of Diabetes: Type 2.   ASSESSMENT  Primary concern: Pt states he is mostly concerned about his food choices and how to make healthy meals.   History includes:  allergies, asthma, cancer, HTN, type 2 diabetes, vitamin D deficiency Labs noted:  03/12/22: A1c 6.8% Medications include: trulicity Supplements: vitamin D 50,000 1x/wk  Pt is visually impaired. Pt unable to read handouts but described in detail, and pt said his wife would help him with them so left notes and highlighted important parts.  Patient lives with wife. Pt's wife does shopping and both do cooking. Pt states he asks his wife to buy him fruit, brown rice, and yogurt from the store.  Pt states he bakes all of his food. Pt states he does some cooking and his wife does some but states his wife eats differently than him.   Pt states he has a 'total gym' at home and works out on it for 20-30 miniutes. Pt states he goes and walks around stores.  Pt states one of his fingers getrs numb but he has spoken with MD about this.    Pt states he plans on getting an eye and dental exam.   Height '5\' 10"'$  (1.778 m), weight (!) 337 lb 6.4 oz (153 kg). Body mass index is 48.41 kg/m.   Diabetes Self-Management Education - 08/07/22 0756       Visit Information   Visit Type First/Initial      Initial Visit   Diabetes Type Type 2    Date Diagnosed unsure    Are you currently following a meal plan? Yes   low sodium   What type of meal plan do you follow? low sodium    Are you taking your medications as prescribed? Yes      Health Coping   How would you rate your overall health? Fair      Psychosocial Assessment   Patient Belief/Attitude about Diabetes Motivated to manage diabetes    What is the hardest part  about your diabetes right now, causing you the most concern, or is the most worrisome to you about your diabetes?   Making healty food and beverage choices    Self-care barriers None    Self-management support Doctor's office;Family    Other persons present Patient    Patient Concerns Nutrition/Meal planning;Healthy Lifestyle    Preferred Learning Style No preference indicated    Learning Readiness Ready    How often do you need to have someone help you when you read instructions, pamphlets, or other written materials from your doctor or pharmacy? 1 - Never    What is the last grade level you completed in school? 12th      Pre-Education Assessment   Patient understands the diabetes disease and treatment process. Needs Instruction    Patient understands incorporating nutritional management into lifestyle. Needs Instruction    Patient undertands incorporating physical activity into lifestyle. Needs Instruction    Patient understands using medications safely. Needs Instruction    Patient understands monitoring blood glucose, interpreting and using results Needs Instruction    Patient understands prevention, detection, and treatment of acute complications. Needs Instruction    Patient understands prevention, detection, and treatment of chronic complications. Needs Instruction    Patient understands how to develop  strategies to address psychosocial issues. Needs Instruction    Patient understands how to develop strategies to promote health/change behavior. Needs Instruction      Complications   Last HgB A1C per patient/outside source 6.8 %   03/12/22   How often do you check your blood sugar? Not recommended by provider    Have you had a dilated eye exam in the past 12 months? No    Have you had a dental exam in the past 12 months? No    Are you checking your feet? No      Dietary Intake   Breakfast special K bar OR flavored greek yogurt    Snack (morning) none    Lunch none    Snack  (afternoon) 12pm: sun chips OR salad with beans with ms. dash    Dinner fish with brown rice or baked potato and occasionally salad OR healthy choice spaghetti    Snack (evening) none    Beverage(s) water, SF juice in morning, gatorade zero      Activity / Exercise   Activity / Exercise Type ADL's    How many days per week do you exercise? 5    How many minutes per day do you exercise? 20    Total minutes per week of exercise 100      Patient Education   Previous Diabetes Education No    Disease Pathophysiology Definition of diabetes, type 1 and 2, and the diagnosis of diabetes;Factors that contribute to the development of diabetes;Explored patient's options for treatment of their diabetes    Healthy Eating Role of diet in the treatment of diabetes and the relationship between the three main macronutrients and blood glucose level;Plate Method;Food label reading, portion sizes and measuring food.;Carbohydrate counting;Reviewed blood glucose goals for pre and post meals and how to evaluate the patients' food intake on their blood glucose level.;Meal timing in regards to the patients' current diabetes medication.;Information on hints to eating out and maintain blood glucose control.;Meal options for control of blood glucose level and chronic complications.    Being Active Role of exercise on diabetes management, blood pressure control and cardiac health.;Helped patient identify appropriate exercises in relation to his/her diabetes, diabetes complications and other health issue.    Medications Reviewed patients medication for diabetes, action, purpose, timing of dose and side effects.;Reviewed medication adjustment guidelines for hyperglycemia and sick days.    Monitoring Identified appropriate SMBG and/or A1C goals.;Daily foot exams;Yearly dilated eye exam    Acute complications Taught prevention, symptoms, and  treatment of hypoglycemia - the 15 rule.;Discussed and identified patients' prevention,  symptoms, and treatment of hyperglycemia.;Educated on sick day management    Chronic complications Relationship between chronic complications and blood glucose control;Assessed and discussed foot care and prevention of foot problems;Lipid levels, blood glucose control and heart disease;Identified and discussed with patient  current chronic complications;Dental care;Retinopathy and reason for yearly dilated eye exams;Nephropathy, what it is, prevention of, the use of ACE, ARB's and early detection of through urine microalbumia.;Reviewed with patient heart disease, higher risk of, and prevention    Diabetes Stress and Support Role of stress on diabetes;Identified and addressed patients feelings and concerns about diabetes;Helped patient identify a support system for diabetes management    Lifestyle and Health Coping Lifestyle issues that need to be addressed for better diabetes care      Individualized Goals (developed by patient)   Nutrition General guidelines for healthy choices and portions discussed    Physical Activity Exercise 3-5 times  per week;30 minutes per day    Medications take my medication as prescribed    Monitoring  Not Applicable    Problem Solving Eating Pattern    Reducing Risk examine blood glucose patterns;do foot checks daily;treat hypoglycemia with 15 grams of carbs if blood glucose less than '70mg'$ /dL    Health Coping Ask for help with psychological, social, or emotional issues      Post-Education Assessment   Patient understands the diabetes disease and treatment process. Comprehends key points    Patient understands incorporating nutritional management into lifestyle. Comprehends key points    Patient undertands incorporating physical activity into lifestyle. Comprehends key points    Patient understands using medications safely. Demonstrates understanding / competency    Patient understands monitoring blood glucose, interpreting and using results Comprehends key points     Patient understands prevention, detection, and treatment of acute complications. Comprehends key points    Patient understands prevention, detection, and treatment of chronic complications. Comprehends key points    Patient understands how to develop strategies to address psychosocial issues. Comprehends key points    Patient understands how to develop strategies to promote health/change behavior. Comprehends key points      Outcomes   Expected Outcomes Demonstrated interest in learning. Expect positive outcomes    Future DMSE 3-4 months    Program Status Not Completed             Individualized Plan for Diabetes Self-Management Training:   Learning Objective:  Patient will have a greater understanding of diabetes self-management. Patient education plan is to attend individual and/or group sessions per assessed needs and concerns.   Plan:   Patient Instructions  Aim for 150 minutes of physical activity weekly. Make physical activity a part of your week. Try to include at least 30 minutes of physical activity 5 days each week or at least 150 minutes per week. Regular physical activity promotes overall health-including helping to reduce risk for heart disease and diabetes, promoting mental health, and helping [place] sleep better.     Consider having an eye and dental exam for preventative measures- pg 34 of diabetes book.   Eat more Non-Starchy Vegetables: Aim to make 1/2 your plate vegetables. -If having a healthy choice dinner make some frozen veggies or salad to have with it.  Balance your plate this way: 1/2 plate of non-starchy vegetables 1/4 plate protein (meat or non-meat) 1/4 plate carbohydrate (fruits, grains, starchy vegetables)  Minimize added sugars and refined grains. Choose whole grains and whole wheat.   Make simple meals at home more often than eating out.  When snacking choose Carbohydrate + Protein -grapes + cheese -fruit + nuts  Breakfast: (carb+protein) -  grits + eggs - oatmeal + nuts or boiled egg - cheerios + nuts or boiled egg - special K bar + greek yogurt  Avoid skipping meals. At least have a snack at lunch time instead of nothing. Choose a carbohydrate and protein from balanced snack sheet.   Expected Outcomes:  Demonstrated interest in learning. Expect positive outcomes  Education material provided: ADA - How to Thrive: A Guide for Your Journey with Diabetes, My Plate, and Snack sheet  If problems or questions, patient to contact team via:  Phone  Future DSME appointment: 3-4 months

## 2022-11-04 ENCOUNTER — Ambulatory Visit: Payer: Medicare HMO | Admitting: Dietician

## 2024-12-11 ENCOUNTER — Ambulatory Visit
